# Patient Record
Sex: Male | Born: 1935 | Race: White | Hispanic: No | Smoking: Former smoker
Health system: Southern US, Community
[De-identification: ages and names within clinical notes are randomized; demographics above are authoritative.]

## PROBLEM LIST (undated history)

## (undated) DIAGNOSIS — M069 Rheumatoid arthritis, unspecified: Secondary | ICD-10-CM

## (undated) DIAGNOSIS — Z952 Presence of prosthetic heart valve: Secondary | ICD-10-CM

## (undated) DIAGNOSIS — E785 Hyperlipidemia, unspecified: Secondary | ICD-10-CM

## (undated) DIAGNOSIS — I639 Cerebral infarction, unspecified: Secondary | ICD-10-CM

## (undated) DIAGNOSIS — J449 Chronic obstructive pulmonary disease, unspecified: Secondary | ICD-10-CM

## (undated) DIAGNOSIS — I2510 Atherosclerotic heart disease of native coronary artery without angina pectoris: Secondary | ICD-10-CM

## (undated) DIAGNOSIS — D649 Anemia, unspecified: Secondary | ICD-10-CM

## (undated) DIAGNOSIS — M109 Gout, unspecified: Secondary | ICD-10-CM

## (undated) DIAGNOSIS — I10 Essential (primary) hypertension: Secondary | ICD-10-CM

## (undated) DIAGNOSIS — I350 Nonrheumatic aortic (valve) stenosis: Secondary | ICD-10-CM

## (undated) HISTORY — PX: OTHER SURGICAL HISTORY: SHX169

## (undated) HISTORY — DX: Gout, unspecified: M10.9

## (undated) HISTORY — DX: Essential (primary) hypertension: I10

## (undated) HISTORY — PX: CARDIAC VALVE REPLACEMENT: SHX585

## (undated) HISTORY — DX: Hyperlipidemia, unspecified: E78.5

## (undated) HISTORY — PX: AORTIC VALVE REPLACEMENT (AVR)/CORONARY ARTERY BYPASS GRAFTING (CABG): SHX5725

## (undated) HISTORY — PX: TONSILLECTOMY: SUR1361

## (undated) HISTORY — PX: CORONARY ARTERY BYPASS GRAFT: SHX141

## (undated) HISTORY — PX: EYE SURGERY: SHX253

## (undated) HISTORY — PX: HERNIA REPAIR: SHX51

---

## 2005-07-28 ENCOUNTER — Ambulatory Visit: Payer: Self-pay | Admitting: Family Medicine

## 2006-01-20 ENCOUNTER — Ambulatory Visit: Payer: Self-pay | Admitting: Unknown Physician Specialty

## 2009-07-19 ENCOUNTER — Ambulatory Visit: Payer: Self-pay | Admitting: Surgery

## 2009-07-22 ENCOUNTER — Ambulatory Visit: Payer: Self-pay | Admitting: Surgery

## 2010-06-12 ENCOUNTER — Ambulatory Visit: Payer: Self-pay | Admitting: Family Medicine

## 2011-12-25 LAB — CBC WITH DIFFERENTIAL/PLATELET
Basophil #: 0.1 10*3/uL (ref 0.0–0.1)
Eosinophil #: 0.2 10*3/uL (ref 0.0–0.7)
Eosinophil %: 1.4 %
Lymphocyte #: 0.9 10*3/uL — ABNORMAL LOW (ref 1.0–3.6)
Lymphocyte %: 7.1 %
Monocyte %: 8.4 %
Neutrophil #: 10.9 10*3/uL — ABNORMAL HIGH (ref 1.4–6.5)
RBC: 4 10*6/uL — ABNORMAL LOW (ref 4.40–5.90)
RDW: 17 % — ABNORMAL HIGH (ref 11.5–14.5)

## 2011-12-25 LAB — BASIC METABOLIC PANEL
Anion Gap: 10 (ref 7–16)
BUN: 31 mg/dL — ABNORMAL HIGH (ref 7–18)
Creatinine: 1.49 mg/dL — ABNORMAL HIGH (ref 0.60–1.30)
EGFR (African American): 50 — ABNORMAL LOW
EGFR (Non-African Amer.): 43 — ABNORMAL LOW
Glucose: 159 mg/dL — ABNORMAL HIGH (ref 65–99)
Osmolality: 293 (ref 275–301)
Sodium: 142 mmol/L (ref 136–145)

## 2011-12-25 LAB — PROTIME-INR: INR: 0.9

## 2011-12-25 LAB — CK TOTAL AND CKMB (NOT AT ARMC): CK-MB: 1.7 ng/mL (ref 0.5–3.6)

## 2011-12-26 ENCOUNTER — Inpatient Hospital Stay: Payer: Self-pay | Admitting: Internal Medicine

## 2011-12-26 LAB — HEMOGLOBIN: HGB: 9.5 g/dL — ABNORMAL LOW (ref 13.0–18.0)

## 2011-12-27 LAB — BASIC METABOLIC PANEL
Anion Gap: 9 (ref 7–16)
BUN: 16 mg/dL (ref 7–18)
EGFR (African American): >60
EGFR (Non-African Amer.): >60
Osmolality: 281 (ref 275–301)
Potassium: 4.4 mmol/L (ref 3.5–5.1)
Sodium: 140 mmol/L (ref 136–145)

## 2011-12-27 LAB — CBC WITH DIFFERENTIAL/PLATELET
Basophil %: 1.5 %
Eosinophil %: 1.9 %
HCT: 25.8 % — ABNORMAL LOW (ref 40.0–52.0)
Lymphocyte #: 0.9 10*3/uL — ABNORMAL LOW (ref 1.0–3.6)
Lymphocyte %: 9.7 %
Monocyte #: 0.9 x10 3/mm (ref 0.2–1.0)
Monocyte %: 9.5 %
Neutrophil %: 77.4 %
Platelet: 134 10*3/uL — ABNORMAL LOW (ref 150–440)
RBC: 2.84 10*6/uL — ABNORMAL LOW (ref 4.40–5.90)
RDW: 17.2 % — ABNORMAL HIGH (ref 11.5–14.5)
WBC: 9.2 10*3/uL (ref 3.8–10.6)

## 2012-05-09 ENCOUNTER — Ambulatory Visit: Payer: Self-pay | Admitting: Cardiology

## 2012-05-23 ENCOUNTER — Other Ambulatory Visit: Payer: Self-pay | Admitting: Rheumatology

## 2012-05-23 LAB — SYNOVIAL CELL COUNT + DIFF, W/ CRYSTALS
Eosinophil: 0 %
Lymphocytes: 4 %
Neutrophils: 83 %
Nucleated Cell Count: 14075 /mm3
Other Mononuclear Cells: 13 %

## 2012-11-28 ENCOUNTER — Encounter: Payer: Self-pay | Admitting: Cardiology

## 2012-12-01 ENCOUNTER — Encounter: Payer: Self-pay | Admitting: Cardiology

## 2013-01-01 ENCOUNTER — Encounter: Payer: Self-pay | Admitting: Cardiology

## 2013-01-19 ENCOUNTER — Encounter: Payer: Self-pay | Admitting: Rheumatology

## 2013-01-25 ENCOUNTER — Other Ambulatory Visit: Payer: Self-pay | Admitting: Rheumatology

## 2013-01-25 LAB — SYNOVIAL CELL COUNT + DIFF, W/ CRYSTALS
Basophil: 0 %
Eosinophil: 0 %
Neutrophils: 70 %
Other Cells BF: 0 %
Other Mononuclear Cells: 23 %

## 2013-01-31 ENCOUNTER — Encounter: Payer: Self-pay | Admitting: Rheumatology

## 2013-02-01 ENCOUNTER — Encounter: Payer: Self-pay | Admitting: Cardiology

## 2014-01-03 ENCOUNTER — Ambulatory Visit: Payer: Self-pay | Admitting: Ophthalmology

## 2014-11-25 NOTE — Consult Note (Signed)
PATIENT NAME:  Dakota [name], Dakota [name] MR#:  [id number] DATE OF BIRTH:  1925-08-29  DATE OF CONSULTATION:  12/26/2011  REFERRING PHYSICIAN:   CONSULTING PHYSICIAN:  Ezzard Standing. Bluford Kaufmann, MD  REASON FOR REFERRAL: Hematochezia.   DESCRIPTION: The patient is an 81 year old white male with known history of gout, kidney stones, and hypertension who started passing blood per rectum earlier this evening. The patient was trying to go to the bathroom and then started seeing some blood. This was then followed by several more episodes of liquidy blood. Throughout this he had no abdominal pain, nausea, vomiting, or fever.   The patient has been experiencing some swelling in his left foot and hand and has been on some prednisone by Dr. Gavin Potters. He has also been taking Etodolac as well. He also admitted to taking some Aleve and Naprosyn p.r.n. for pain.   The patient has had at least two colonoscopies in the past. They were both performed by Dr. Mechele Collin. The patient has had history of diverticulosis, diffuse, colonic polyps that have been removed, large lipoma and internal hemorrhoids. The last colonoscopy was done around 2007. The patient also admitted to eating a lot of nuts and popcorn recently as well.   REVIEW OF SYSTEMS: There is no fever or chills. No coughing, shortness of breath, chest pain or palpitations. There is no nausea, heartburn, or indigestion. Again, there is no abdominal pain, however, there is gross hematochezia. The rest of the review of symptoms have been described already.   PAST MEDICAL HISTORY:  1. Hypertension. 2. Gout. 3. Kidney stones.  4. Glaucoma.  5. Again, last colonoscopy was in 2007.   PAST SURGICAL HISTORY:  1. Inguinal hernia repair. 2. Left arm surgery. 3. Kidney stone removal.   ALLERGIES: He is allergic to penicillin.   MEDICATIONS AT HOME:  1. Allopurinol. 2. Benicar. 3. Glaucoma drops. 4. Etodolac. 5. Prednisone taper.   FAMILY HISTORY: Essentially negative.    SOCIAL HISTORY: He quit smoking 25 years ago and quit alcohol in 1978.   PHYSICAL EXAMINATION:   GENERAL: The patient is in no acute distress.   VITAL SIGNS: He was afebrile. Initial vital signs show temperature 98, pulse 114, respirations 18, blood pressure 119/67.   HEENT: Normocephalic, atraumatic head. Pupils are equally reactive. Throat was clear.   NECK: Supple.   CARDIAC: Regular rhythm and rate but he was somewhat tachycardic. I did appreciate a systolic murmur.   LUNGS: Lungs are clear bilaterally.   ABDOMEN: Normoactive bowel sounds, soft, nontender. There is no hepatomegaly. There are no palpable masses. The patient had active bowel sounds.   EXTREMITIES: Some edema in the lower extremities. There was also some swelling in his right hand area.  LABS ON ADMISSION: White count 13.3, hemoglobin 11.4, platelet count 230, sodium 142, potassium 4.5, chloride 106, CO2 26, BUN 31, creatinine 1.49, glucose 159. INR 0.9. A bleeding scan was done on admission that came back negative.   ASSESSMENT AND PLAN: This is a patient with known history of diffuse diverticulosis and internal hemorrhoids. He also has a history of polyps, the pathology of which I do not have. Most likely bleeding is diverticulosis although he could have some internal hemorrhoids again because he was straining when he started passing blood.   My recommendation is to monitor hemoglobin. We will start him on a clear liquid diet and advance as tolerated if the bleeding stops. Hopefully with him being off the prednisone and etodolac that the bleeding will stop on its own  without [place] having to do any further intervention. On the other hand, if the bleeding recurs again then we will consider repeating the colonoscopy.   Thank you for the referral.   ____________________________ Ezzard Standing. Bluford Kaufmann, MD pyo:drc D: 12/27/2011 09:10:00 ET T: 12/27/2011 10:33:58 ET JOB#: [id number]  cc: Ezzard Standing. Bluford Kaufmann, MD, <Dictator> Ezzard Standing Jill Stopka  MD ELECTRONICALLY SIGNED 12/29/2011 10:25

## 2014-11-25 NOTE — Consult Note (Signed)
Chief Complaint:   Subjective/Chief Complaint C/O more of muscle aches. Prednisone which were  helping was held since admission. Reg BM's now. No further bleeding. Hgb stable since yest.   VITAL SIGNS/ANCILLARY NOTES: **Vital Signs.:   26-May-13 08:52   Vital Signs Type Routine   Temperature Temperature (F) 100   Celsius 37.7   Pulse Pulse 99   Respirations Respirations 18   Systolic BP Systolic BP 94   Diastolic BP (mmHg) Diastolic BP (mmHg) 60   Mean BP 71   Pulse Ox % Pulse Ox % 100   Pulse Ox Activity Level  At rest   Oxygen Delivery Room Air/ 21 %   Brief Assessment:   Cardiac Regular    Respiratory clear BS    Gastrointestinal Normal    Additional Physical Exam Right arm swollen, which is chronic   Routine Hem:  26-May-13 05:47    WBC (CBC) 9.2   RBC (CBC) 2.84   Hemoglobin (CBC) 8.3   Hematocrit (CBC) 25.8   Platelet Count (CBC) 134   MCV 91   MCH 29.2   MCHC 32.2   RDW 17.2   Neutrophil % 77.4   Lymphocyte % 9.7   Monocyte % 9.5   Eosinophil % 1.9   Basophil % 1.5   Neutrophil # 7.1   Lymphocyte # 0.9   Monocyte # 0.9   Eosinophil # 0.2   Basophil # 0.1  Routine Chem:  26-May-13 05:47    Glucose, Serum 108   BUN 16   Creatinine (comp) 0.96   Sodium, Serum 140   Potassium, Serum 4.4   Chloride, Serum 108   CO2, Serum 23   Calcium (Total), Serum 7.8   Anion Gap 9   Osmolality (calc) 281   eGFR (African American) >60   eGFR (Non-African American) >60   Magnesium, Serum 1.5   Assessment/Plan:  Assessment/Plan:   Assessment Likely diverticular bleeding.    Plan If tolerates solids, then can discharge patient to home on high fiber diet. Can resume po prednisone if patient wishes. Can f/u in our office later. Will sign off. Thanks.   Electronic Signatures: Verdie Shire (MD)  (Signed [phone] 09:04)  Authored: Chief Complaint, VITAL SIGNS/ANCILLARY NOTES, Brief Assessment, Lab Results, Assessment/Plan   Last Updated: 26-May-13 09:04 by Verdie Shire (MD)

## 2014-11-25 NOTE — Consult Note (Signed)
Full consult to follow. Admitted with painless rectal bleeding. Known hx of diffuse diverticulosis, colon polyps(path not available for review), large lipoma, and internal hemorrhoids. Bleeding did start after straining for defacation. Pt on NSAIDS/prednisone recently. Nuts and popcorn intake recently as well. Bleeding scan overnight neg. No further bleeding since. Hgb drifting downwards. Likely diverticular bleeding but hemorrhoidal bleeding also possible. Moniter hgb and transfuse if needed. Advance diet as tolerated. Repeat colonoscopy only if bleeding recurs and is significant. Thanks.  Electronic Signatures: Lutricia Feil (MD)  (Signed on 25-May-13 09:30)  Authored  Last Updated: 25-May-13 09:30 by Lutricia Feil (MD)

## 2014-11-25 NOTE — Discharge Summary (Signed)
PATIENT NAME:  Dakota [name], Dakota [name] MR#:  [id number] DATE OF BIRTH:  02/27/1926  DATE OF ADMISSION:  12/26/2011 DATE OF DISCHARGE:  12/27/2011  ADMITTING DIAGNOSIS: Bright red blood per rectum.  DISCHARGE DIAGNOSES:  1. Acute lower gastrointestinal bleed felt to be due to possible internal hemorrhoids and/or diverticular in nature status post right gastroenterology evaluation.  2. Acute blood loss anemia due to lower gastrointestinal bleed. The patient's hemoglobin did drop to 8.3, did not require transfusion. Will repeat a CBC in primary MD's office.  3. Acute renal failure due to dehydration, now resolved.  4. 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 Gavin Potters in the coming week. 5. Low-grade fevers, again likely related to his arthritis.  6. Distant alcohol abuse, no alcohol since 1978.  7. Hypertension.  8. Gout.  9. History of nephrolithiasis.  10. Glaucoma.  11. History of diverticulosis, colonic polyp, large lipoma, and internal hemorrhoids.  12. Status post left inguinal hernia repair.  13. Status post left arm surgery.  14. History of lithotripsy x3.  PERTINENT LABS AND EVALUATIONS: Admitting glucose 159. On admission BUN 31, creatinine 1.49, sodium 142, potassium 4.5, chloride 106, CO2 26, calcium 8.4. Troponin less than 0.02. WBC 13.3, hemoglobin 11.4, platelet count 230. INR 0.9. Hemoglobin today is 8.3. Magnesium was 1.5.   GI bleeding scan showed no evidence of acute GI bleed.  CONSULTANTS: Lutricia Feil, MD  HOSPITAL COURSE: Please see the history and physical done by the admitting physician. The patient is a pleasant 81 year old white male with history of hypertension, gout, glaucoma, nephrolithiasis, and previous history of diverticulosis who presented with acute onset of bright red blood per rectum. The patient reported prior to the episode he was straining  significantly. He also had recently been on prednisone and NSAIDs. The patient was seen in the ED and had a bleeding scan initially, which was negative. It was felt that his bleeding was likely due to internal hemorrhoids or diverticular in nature. He was monitored in the hospital, was seen by gastroenterology, Dr. Bluford Kaufmann, and his diet was advanced slowly. The patient's hemoglobin did drop due to acute blood loss, but has not had any further bleeding. He was seen by Dr. Bluford Kaufmann who stated that if he is able to tolerate solid food, he is able to be discharged home. At this time, the patient is stable for discharge.   DISCHARGE MEDICATIONS:  1. Allopurinol 300 mg daily. 2. Guaifenesin 400 mg daily as needed. 3. Benicar 20 mg daily.  4. Prednisone taper 10 mg p.o. daily x5 days. 5. Prednisone 5 mg p.o. daily x5 days.  6. Tylenol 650 mg every 6 hours p.r.n. pain.  7. MiraLax 17 grams p.o. daily in 8 ounces of water daily.   NOTE: The patient is requested not to take Aleve or etodolac for now.   DIET: Low sodium.   ACTIVITY: As tolerated.   FOLLOWUP: Followup with Dr. Elizabeth Sauer in 1 to 2 weeks. Check CBC at time of visit to the primary MD.   TIME SPENT: 35 minutes. ____________________________ Lacie Scotts Allena Katz, MD shp:slb D: 12/27/2011 12:22:05 ET     T: 12/28/2011 14:17:18 ET        JOB#: [id number] cc: Elysia Grand H. Allena Katz, MD, <Dictator> Charise Carwin MD ELECTRONICALLY SIGNED 12/31/2011 13:36

## 2014-11-25 NOTE — H&P (Signed)
PATIENT NAME:  Dakota [name], Dakota [name] MR#:  [id number] DATE OF BIRTH:  Jul 10, 1926  DATE OF ADMISSION:  12/26/2011  REFERRING PHYSICIAN: Dr. Daphine Deutscher   PRIMARY CARE PHYSICIAN: Elizabeth Sauer, MD    PRESENTING COMPLAINT: Bloody stools.   HISTORY OF PRESENT ILLNESS: Dakota [name] is a pleasant 81 year old gentleman with history of hypertension, gout, glaucoma, nephrolithiasis, and diverticulosis who presents with reports of developing onset of bright red blood per rectum earlier this evening. He had initial bowel movement which was formed followed by frank bloody stool. He then had a second episode which was pure liquid blood followed by a third episode with pure liquid blood and on his arrival here had a fourth episode where he had another frank bleeding. He denies any abdominal pain. No nausea or vomiting. No hematemesis. He denies any presyncope or syncope. No chest pain or shortness of breath. No palpitations. The patient reports that approximately 12 weeks ago he started having problems with left foot pain and swelling as well as right hand pain and swelling. On 12/08/2011 he was initiated on Etodolac as well as prednisone taper. He completed the prednisone taper yesterday but is still on Etodolac. Prior to that he was taking limited use of Naprosyn and Aleve as needed for pain.   PAST MEDICAL HISTORY:  1. Distant alcohol abuse. No alcohol since 1978.  2. Hypertension.  3. Gout.  4. Nephrolithiasis.  5. Glaucoma.  6. Colonoscopy in June 2007 showed diverticulosis, colonic polyps, large lipoma, and internal hemorrhoids.   PAST SURGICAL HISTORY:  1. Left inguinal hernia repair.  2. Left arm surgery.  3. Lithotripsies at least three times.   ALLERGIES: Penicillin.   MEDICATIONS:  1. Allopurinol 300 mg daily.  2. Benicar 20 mg daily.  3. Guaifenesin over-the-counter 400 mg daily.  4. Glaucoma drops, unknown name or dose.  5. Etodolac 500 mg b.i.d.  6. Complete a prednisone taper yesterday.    FAMILY HISTORY: No heart attack, strokes, diabetes, hypertension.   SOCIAL HISTORY: He lives in [place] with his wife. He quit tobacco 25 years ago. Quit alcohol in 1978. Previous history of alcoholism. No drug use. He was in the [employer].  REVIEW OF SYSTEMS: CONSTITUTIONAL: No fevers, nausea, or vomiting. EYES: History of glaucoma. ENT: No epistaxis or discharge. RESPIRATORY: No cough, wheezing, hemoptysis. CARDIOVASCULAR: No chest pain, palpitations, syncope. He had edema of the left lower leg and right hand. GU: No dysuria or hematuria. ENDOCRINE: No polyuria. HEME: No bleeding. SKIN: He developed a rash on his left lower extremity after working in the yard. MUSCULOSKELETAL: Reports pain in his left foot and leg as well as right hand with history of gout. NEUROLOGIC: No history of stroke or seizures. PSYCH: Denies any suicidal ideation.   PHYSICAL EXAMINATION:   VITAL SIGNS: Temperature 98, pulse 114, respiratory rate 18, blood pressure 119/67, sating at 98% on room air.   GENERAL: Lying in bed in no apparent distress.   HEENT: Normocephalic, atraumatic. Pupils equal, symmetric, nonicteric. Nares without discharge. Moist mucous membrane.   NECK: Soft and supple. No adenopathy or JVP.   CARDIOVASCULAR: Slightly tachy with systolic murmur. No rubs or gallops.   LUNGS: Clear to auscultation bilaterally. No use of accessory muscles or increased respiratory effort.   ABDOMEN: Soft with hyperactive bowel sounds. No mass appreciated.   EXTREMITIES: Trace edema of the left lower extremity. Dorsal pedis pulses 1+ bilaterally.   MUSCULOSKELETAL: No joint effusion in his ankle or knees. He has joint effusion of his  right hand.   NEUROLOGIC: No dysarthria or aphasia. Strength exam is limited as the patient has pain and swelling of his right hand and does not have full squeeze. He has 5 out of 5 strength of his left hand.   PERTINENT LABS AND STUDIES: WBC 13.3, hemoglobin 11.4, hematocrit 36,  platelet 230, MCV 90, glucose 159, BUN 31, creatinine 1.49, sodium 142, potassium 4.5, chloride 106, carbon dioxide 26, calcium 8.4. INR 0.9. CK 36. MB 1.7, troponin less than 0.02.   ASSESSMENT AND PLAN: Dakota [name] is a pleasant 81 year old gentleman with history of hypertension, gout, diverticulosis, glaucoma, distant alcohol abuse, and nephrolithiasis presenting with reports of bloody stools.  1. GI bleed, likely lower, possible source is diverticular. Colonoscopy results as above. Mild leukocytosis likely in the setting of bleed, low suspicion for diverticulitis. Will hold off on antibiotics for now unless recommended by GI. The patient has been on Etodolac and steroid taper for presumed gout since 12/08/2011. Also reports prior history of Naprosyn and Aleve, likely perpetuated his GI bleed, doubtful alcohol related as he has not had any alcohol for over 30 years. Will start on PPI b.i.d., clear diet, IV fluids. Cycle his hemoglobin every six hours. Transfuse as needed. With relative hypotension likely in the setting of his bleed and hypovolemia, will obtain a bleeding scan and GI consultation. Further work-up and recommendations per findings.  2. Acute renal failure with elevated BUN likely in the setting of bleed. As above, IV fluids and IV transfusion if needed. Will hold Benicar for now.  3. Hypertension with relative hypotension. As above, holding Benicar.  4. Questionable gout related flare. He is in the process of outpatient work-up for right hand and left foot pain and swelling with Podiatry, Rheumatology, and Vascular Surgery. Will resume his allopurinol with close following of his creatinine.  5. Hyperglycemia likely with recent steroid use and stress state. Will send an A1c.  6. Prophylaxis with TEDs, SCDs, and Protonix.     TIME SPENT: Approximately 50 minutes on patient care.   ____________________________ Reuel Derby, MD ap:drc D: 12/26/2011 00:58:31 ET T: 12/26/2011  09:10:41 ET JOB#: [id number]  cc: Pearlean Brownie Eleena Grater, MD, <Dictator> Duanne Limerick, MD Reuel Derby MD ELECTRONICALLY SIGNED 12/30/2011 22:50

## 2015-05-21 ENCOUNTER — Ambulatory Visit (INDEPENDENT_AMBULATORY_CARE_PROVIDER_SITE_OTHER): Payer: Medicare PPO

## 2015-05-21 DIAGNOSIS — Z23 Encounter for immunization: Secondary | ICD-10-CM | POA: Diagnosis not present

## 2016-01-20 ENCOUNTER — Other Ambulatory Visit: Payer: Self-pay | Admitting: Family Medicine

## 2016-05-01 ENCOUNTER — Ambulatory Visit (INDEPENDENT_AMBULATORY_CARE_PROVIDER_SITE_OTHER): Payer: Medicare PPO | Admitting: Family Medicine

## 2016-05-01 ENCOUNTER — Encounter: Payer: Self-pay | Admitting: Family Medicine

## 2016-05-01 VITALS — BP 120/72 | HR 84 | Temp 98.0°F | Ht 68.0 in | Wt 187.0 lb

## 2016-05-01 DIAGNOSIS — Z23 Encounter for immunization: Secondary | ICD-10-CM

## 2016-05-01 DIAGNOSIS — J4520 Mild intermittent asthma, uncomplicated: Secondary | ICD-10-CM | POA: Diagnosis not present

## 2016-05-01 DIAGNOSIS — J0100 Acute maxillary sinusitis, unspecified: Secondary | ICD-10-CM | POA: Diagnosis not present

## 2016-05-01 DIAGNOSIS — I739 Peripheral vascular disease, unspecified: Secondary | ICD-10-CM | POA: Diagnosis not present

## 2016-05-01 MED ORDER — AZITHROMYCIN 250 MG PO TABS: ORAL_TABLET | ORAL | 0 refills | Status: DC

## 2016-05-01 MED ORDER — ALBUTEROL SULFATE HFA 108 (90 BASE) MCG/ACT IN AERS: 2.0000 | INHALATION_SPRAY | Freq: Four times a day (QID) | RESPIRATORY_TRACT | 0 refills | Status: DC | PRN

## 2016-05-01 NOTE — Patient Instructions (Signed)
How to Use an Inhaler °Proper inhaler technique is very important. Good technique ensures that the medicine reaches the lungs. Poor technique results in depositing the medicine on the tongue and back of the throat rather than in the airways. If you do not use the inhaler with good technique, the medicine will not help you. °STEPS TO FOLLOW IF USING AN INHALER WITHOUT AN EXTENSION TUBE °1. Remove the cap from the inhaler. °2. If you are using the inhaler for the first time, you will need to prime it. Shake the inhaler for 5 seconds and release four puffs into the air, away from your face. Ask your health care provider or pharmacist if you have questions about priming your inhaler. °3. Shake the inhaler for 5 seconds before each breath in (inhalation). °4. Position the inhaler so that the top of the canister faces up. °5. Put your index finger on the top of the medicine canister. Your thumb supports the bottom of the inhaler. °6. Open your mouth. °7. Either place the inhaler between your teeth and place your lips tightly around the mouthpiece, or hold the inhaler 1-2 inches away from your open mouth. If you are unsure of which technique to use, ask your health care provider. °8. Breathe out (exhale) normally and as completely as possible. °9. Press the canister down with your index finger to release the medicine. °10. At the same time as the canister is pressed, inhale deeply and slowly until your lungs are completely filled. This should take 4-6 seconds. Keep your tongue down. °11. Hold the medicine in your lungs for 5-10 seconds (10 seconds is best). This helps the medicine get into the small airways of your lungs. °12. Breathe out slowly, through pursed lips. Whistling is an example of pursed lips. °13. Wait at least 15-30 seconds between puffs. Continue with the above steps until you have taken the number of puffs your health care provider has ordered. Do not use the inhaler more than your health care provider  tells you. °14. Replace the cap on the inhaler. °15. Follow the directions from your health care provider or the inhaler insert for cleaning the inhaler. °STEPS TO FOLLOW IF USING AN INHALER WITH AN EXTENSION (SPACER) °1. Remove the cap from the inhaler. °2. If you are using the inhaler for the first time, you will need to prime it. Shake the inhaler for 5 seconds and release four puffs into the air, away from your face. Ask your health care provider or pharmacist if you have questions about priming your inhaler. °3. Shake the inhaler for 5 seconds before each breath in (inhalation). °4. Place the open end of the spacer onto the mouthpiece of the inhaler. °5. Position the inhaler so that the top of the canister faces up and the spacer mouthpiece faces you. °6. Put your index finger on the top of the medicine canister. Your thumb supports the bottom of the inhaler and the spacer. °7. Breathe out (exhale) normally and as completely as possible. °8. Immediately after exhaling, place the spacer between your teeth and into your mouth. Close your lips tightly around the spacer. °9. Press the canister down with your index finger to release the medicine. °10. At the same time as the canister is pressed, inhale deeply and slowly until your lungs are completely filled. This should take 4-6 seconds. Keep your tongue down and out of the way. °11. Hold the medicine in your lungs for 5-10 seconds (10 seconds is best). This helps the   medicine get into the small airways of your lungs. Exhale. °12. Repeat inhaling deeply through the spacer mouthpiece. Again hold that breath for up to 10 seconds (10 seconds is best). Exhale slowly. If it is difficult to take this second deep breath through the spacer, breathe normally several times through the spacer. Remove the spacer from your mouth. °13. Wait at least 15-30 seconds between puffs. Continue with the above steps until you have taken the number of puffs your health care provider has  ordered. Do not use the inhaler more than your health care provider tells you. °14. Remove the spacer from the inhaler, and place the cap on the inhaler. °15. Follow the directions from your health care provider or the inhaler insert for cleaning the inhaler and spacer. °If you are using different kinds of inhalers, use your quick relief medicine to open the airways 10-15 minutes before using a steroid if instructed to do so by your health care provider. If you are unsure which inhalers to use and the order of using them, ask your health care provider, nurse, or respiratory therapist. °If you are using a steroid inhaler, always rinse your mouth with water after your last puff, then gargle and spit out the water. Do not swallow the water. °AVOID: °· Inhaling before or after starting the spray of medicine. It takes practice to coordinate your breathing with triggering the spray. °· Inhaling through the nose (rather than the mouth) when triggering the spray. °HOW TO DETERMINE IF YOUR INHALER IS FULL OR NEARLY EMPTY °You cannot know when an inhaler is empty by shaking it. A few inhalers are now being made with dose counters. Ask your health care provider for a prescription that has a dose counter if you feel you need that extra help. If your inhaler does not have a counter, ask your health care provider to help you determine the date you need to refill your inhaler. Write the refill date on a calendar or your inhaler canister. Refill your inhaler 7-10 days before it runs out. Be sure to keep an adequate supply of medicine. This includes making sure it is not expired, and that you have a spare inhaler.  °SEEK MEDICAL CARE IF:  °· Your symptoms are only partially relieved with your inhaler. °· You are having trouble using your inhaler. °· You have some increase in phlegm. °SEEK IMMEDIATE MEDICAL CARE IF:  °· You feel little or no relief with your inhalers. You are still wheezing and are feeling shortness of breath or  tightness in your chest or both. °· You have dizziness, headaches, or a fast heart rate. °· You have chills, fever, or night sweats. °· You have a noticeable increase in phlegm production, or there is blood in the phlegm. °MAKE SURE YOU:  °· Understand these instructions. °· Will watch your condition. °· Will get help right away if you are not doing well or get worse. °  °This information is not intended to replace advice given to you by your health care provider. Make sure you discuss any questions you have with your health care provider. °  °Document Released: 07/17/2000 Document Revised: 05/10/2013 Document Reviewed: 02/16/2013 °[employer] Interactive Patient Education ©2016 [employer] ° °

## 2016-05-01 NOTE — Progress Notes (Signed)
Name: Dakota [name]   MRN: [id number]    DOB: 29-May-1926   Date:05/01/2016       Progress Note  Subjective  Chief Complaint  Chief Complaint  Patient presents with  . Sinusitis    cough and cong- wheezing    Sinusitis  This is a new problem. The current episode started more than 1 year ago. There has been no fever. The pain is mild. Associated symptoms include congestion, ear pain, sinus pressure and a sore throat. Pertinent negatives include no chills, coughing, diaphoresis, headaches, hoarse voice, neck pain, shortness of breath, sneezing or swollen glands. Past treatments include nothing. The treatment provided mild relief.  Cough  This is a recurrent problem. The current episode started in the past 7 days. The problem has been waxing and waning. The cough is non-productive. Associated symptoms include ear pain, a sore throat and wheezing. Pertinent negatives include no chest pain, chills, fever, headaches, heartburn, hemoptysis, myalgias, postnasal drip, rash, shortness of breath or weight loss. He has tried a beta-agonist inhaler (per daughter) for the symptoms. The treatment provided mild relief. There is no history of emphysema or environmental allergies.  Leg Pain   Incident onset: hx pseudoaneurysm right femoral. The pain is present in the right toes and left toes. The quality of the pain is described as aching. The pain has been intermittent (cold/cyanosis) since onset. Pertinent negatives include no tingling. He has tried nothing for the symptoms.    No problem-specific Assessment & Plan notes found for this encounter.   Past Medical History:  Diagnosis Date  . Gout   . Hyperlipidemia   . Hypertension     Past Surgical History:  Procedure Laterality Date  . CARDIAC VALVE REPLACEMENT    . EYE SURGERY     cataract  . HERNIA REPAIR      History reviewed. No pertinent family history.  Social History   Social History  . Marital status: Widowed    Spouse name: N/A  .  Number of children: N/A  . Years of education: N/A   Occupational History  . Not on file.   Social History Main Topics  . Smoking status: Former Games developer  . Smokeless tobacco: Never Used  . Alcohol use Not on file  . Drug use: Unknown  . Sexual activity: Not on file   Other Topics Concern  . Not on file   Social History Narrative  . No narrative on file    Allergies  Allergen Reactions  . Colchicine Shortness Of Breath  . Penicillin G Rash     Review of Systems  Constitutional: Negative for chills, diaphoresis, fever, malaise/fatigue and weight loss.  HENT: Positive for congestion, ear pain, sinus pressure and sore throat. Negative for ear discharge, hoarse voice, postnasal drip and sneezing.   Eyes: Negative for blurred vision.  Respiratory: Positive for wheezing. Negative for cough, hemoptysis, sputum production and shortness of breath.   Cardiovascular: Negative for chest pain, palpitations and leg swelling.  Gastrointestinal: Negative for abdominal pain, blood in stool, constipation, diarrhea, heartburn, melena and nausea.  Genitourinary: Negative for dysuria, frequency, hematuria and urgency.  Musculoskeletal: Negative for back pain, joint pain, myalgias and neck pain.  Skin: Negative for rash.  Neurological: Negative for dizziness, tingling, sensory change, focal weakness and headaches.  Endo/Heme/Allergies: Negative for environmental allergies and polydipsia. Does not bruise/bleed easily.  Psychiatric/Behavioral: Negative for depression and suicidal ideas. The patient is not nervous/anxious and does not have insomnia.      Objective  Vitals:   05/01/16 1356  BP: 120/72  Pulse: 84  Temp: 98 F (36.7 C)  TempSrc: Oral  Weight: 187 lb (84.8 kg)  Height: 5\' 8"  (1.727 m)    Physical Exam  Constitutional: He is oriented to person, place, and time and well-developed, well-nourished, and in no distress.  HENT:  Head: Normocephalic.  Right Ear: Tympanic  membrane, external ear and ear canal normal.  Left Ear: Tympanic membrane, external ear and ear canal normal.  Nose: Nose normal.  Mouth/Throat: Oropharynx is clear and moist. No posterior oropharyngeal erythema.  Eyes: Conjunctivae and EOM are normal. Pupils are equal, round, and reactive to light. Right eye exhibits no discharge. Left eye exhibits no discharge. No scleral icterus.  Neck: Normal range of motion. Neck supple. No JVD present. No tracheal deviation present. No thyromegaly present.  Cardiovascular: Normal rate, regular rhythm, normal heart sounds and intact distal pulses.  Exam reveals no gallop and no friction rub.   No murmur heard. Pulses:      Dorsalis pedis pulses are 0 on the right side, and 0 on the left side.       Posterior tibial pulses are 0 on the right side, and 0 on the left side.  Pulmonary/Chest: Effort normal. No respiratory distress. He has wheezes. He has no rales.  Abdominal: Soft. Bowel sounds are normal. He exhibits no mass. There is no hepatosplenomegaly. There is no tenderness. There is no rebound, no guarding and no CVA tenderness.  Musculoskeletal: Normal range of motion. He exhibits no edema or tenderness.  Lymphadenopathy:    He has no cervical adenopathy.  Neurological: He is alert and oriented to person, place, and time. He has normal sensation, normal strength, normal reflexes and intact cranial nerves. No cranial nerve deficit.  Skin: Skin is warm. No rash noted. There is cyanosis.  Cyanosis toes r>l  Psychiatric: Mood and affect normal.      Assessment & Plan  Problem List Items Addressed This Visit    None    Visit Diagnoses    Acute maxillary sinusitis, recurrence not specified    -  Primary   Relevant Medications   hydroxychloroquine (PLAQUENIL) 200 MG tablet   azithromycin (ZITHROMAX) 250 MG tablet   Reactive airway disease, mild intermittent, uncomplicated       Relevant Medications   albuterol (PROVENTIL HFA;VENTOLIN HFA) 108  (90 Base) MCG/ACT inhaler   PAD (peripheral artery disease) (HCC)       Relevant Medications   furosemide (LASIX) 20 MG tablet   metoprolol tartrate (LOPRESSOR) 25 MG tablet   pravastatin (PRAVACHOL) 10 MG tablet   aspirin EC 81 MG tablet   Other Relevant Orders   Ambulatory referral to Vascular Surgery   Immunization due       Relevant Orders   Flu Vaccine QUAD 36+ mos PF IM (Fluarix & Fluzone Quad PF) (Completed)     I spent 15 minutes with this patient, More than 50% of that time was spent in face to face education, counseling and care coordination.   Dr. [facility]  05/01/16

## 2016-05-12 ENCOUNTER — Encounter (INDEPENDENT_AMBULATORY_CARE_PROVIDER_SITE_OTHER): Payer: Self-pay | Admitting: Vascular Surgery

## 2016-05-12 ENCOUNTER — Ambulatory Visit (INDEPENDENT_AMBULATORY_CARE_PROVIDER_SITE_OTHER): Payer: Medicare PPO | Admitting: Vascular Surgery

## 2016-05-12 VITALS — BP 163/72 | HR 70 | Resp 16 | Ht 68.5 in | Wt 189.0 lb

## 2016-05-12 DIAGNOSIS — I10 Essential (primary) hypertension: Secondary | ICD-10-CM | POA: Diagnosis not present

## 2016-05-12 DIAGNOSIS — M79662 Pain in left lower leg: Secondary | ICD-10-CM

## 2016-05-12 DIAGNOSIS — M79609 Pain in unspecified limb: Secondary | ICD-10-CM | POA: Insufficient documentation

## 2016-05-12 DIAGNOSIS — E785 Hyperlipidemia, unspecified: Secondary | ICD-10-CM | POA: Diagnosis not present

## 2016-05-12 DIAGNOSIS — M7989 Other specified soft tissue disorders: Secondary | ICD-10-CM | POA: Diagnosis not present

## 2016-05-12 DIAGNOSIS — I2510 Atherosclerotic heart disease of native coronary artery without angina pectoris: Secondary | ICD-10-CM | POA: Diagnosis not present

## 2016-05-12 NOTE — Assessment & Plan Note (Signed)
lipid control important in reducing the progression of atherosclerotic disease. Continue statin therapy  

## 2016-05-12 NOTE — Progress Notes (Signed)
Patient ID: Dakota [name], male   DOB: 12-10-1925, 82 y.o.   MRN: [id number]  Chief Complaint  Patient presents with  . New Patient (Initial Visit)    PVD    HPI Dakota [name] is a 82 y.o. male.  I am asked to see the patient by Dr. Yetta Barre for evaluation of swelling and significant cyanosis and discoloration particularly in the left foot.  The patient reports this leg is cool and discolored and has been for several years although he thinks it has gotten steadily worse. Although there was not a clear inciting event or causative factor, they started to notice symptoms more after the vein in the left leg was removed for his AVR/CABG about 4 years ago. The left leg is more noticeable than the right. The right may have a little swelling and discoloration but not as bad as the left this has gotten a little bit better as the weather has cooled off, but is still noticeable. The leg is heavy and achy. He does not have ulceration or infection. He denies fever or chills. He is on multiple medications for gout and intermittently takes steroids which does seem to exacerbate the swelling.   Past Medical History:  Diagnosis Date  . Gout   . Hyperlipidemia   . Hypertension     Past Surgical History:  Procedure Laterality Date  . CARDIAC VALVE REPLACEMENT    . EYE SURGERY     cataract  . HERNIA REPAIR      Family History  Problem Relation Age of Onset  . Congestive Heart Failure Father    No bleeding disorders, clotting disorders, autoimmune diseases, or aneurysms  Social History Social History  Substance Use Topics  . Smoking status: Former Games developer  . Smokeless tobacco: Never Used  . Alcohol use No   Married, lives with spouse  Allergies  Allergen Reactions  . Colchicine Shortness Of Breath  . Penicillin G Rash    Current Outpatient Prescriptions  Medication Sig Dispense Refill  . albuterol (PROVENTIL HFA;VENTOLIN HFA) 108 (90 Base) MCG/ACT inhaler Inhale 2 puffs into the lungs  every 6 (six) hours as needed for wheezing or shortness of breath. 1 Inhaler 0  . allopurinol (ZYLOPRIM) 300 MG tablet Take 1 tablet by mouth 2 (two) times Dakota. Dr Lavenia Atlas    . aspirin EC 81 MG tablet Take 81 mg by mouth Dakota.    Marland Kitchen azithromycin (ZITHROMAX) 250 MG tablet 2 today then 1 a day for 4 days 6 tablet 0  . furosemide (LASIX) 20 MG tablet Take 1 tablet by mouth as needed. Dr Lady Gary    . hydroxychloroquine (PLAQUENIL) 200 MG tablet Take 2 tablets by mouth Dakota. Dr Lavenia Atlas    . metoprolol tartrate (LOPRESSOR) 25 MG tablet Take 1 tablet by mouth 2 (two) times Dakota. Dr Lady Gary    . pravastatin (PRAVACHOL) 10 MG tablet Take 1 tablet by mouth at bedtime. Dr Lady Gary    . triamcinolone cream (KENALOG) 0.1 % APPLY TO FACIAL RASH 2 TIMES Dakota FOR 1 WEEK, THEN Dakota 120 g 1   No current facility-administered medications for this visit.       REVIEW OF SYSTEMS (Negative unless checked)  Constitutional: [] Weight loss  [] Fever  [] Chills Cardiac: [] Chest pain   [] Chest pressure   [x] Palpitations   [] Shortness of breath when laying flat   [] Shortness of breath at rest   [] Shortness of breath with exertion. Vascular:  [] Pain in legs with walking   []   Pain in legs at rest   [] Pain in legs when laying flat   [] Claudication   [] Pain in feet when walking  [] Pain in feet at rest  [] Pain in feet when laying flat   [] History of DVT   [] Phlebitis   [x] Swelling in legs   [] Varicose veins   [] Non-healing ulcers Pulmonary:   [] Uses home oxygen   [] Productive cough   [] Hemoptysis   [] Wheeze  [] COPD   [] Asthma Neurologic:  [] Dizziness  [] Blackouts   [] Seizures   [] History of stroke   [] History of TIA  [] Aphasia   [] Temporary blindness   [] Dysphagia   [] Weakness or numbness in arms   [] Weakness or numbness in legs Musculoskeletal:  [] Arthritis   [] Joint swelling   [x] Joint pain   [] Low back pain Hematologic:  [] Easy bruising  [] Easy bleeding   [] Hypercoagulable state   [] Anemic   [] Hepatitis Gastrointestinal:  [] Blood in stool   [] Vomiting blood  [] Gastroesophageal reflux/heartburn   [] Abdominal pain Genitourinary:  [] Chronic kidney disease   [] Difficult urination  [] Frequent urination  [] Burning with urination   [] Hematuria Skin:  [] Rashes   [] Ulcers   [] Wounds Psychological:  [] History of anxiety   []  History of major depression.    Physical Exam BP (!) 163/72   Pulse 70   Resp 16   Ht 5' 8.5" (1.74 m)   Wt 189 lb (85.7 kg)   BMI 28.32 kg/m  Gen:  WD/WN, NAD Head: [place]/AT, No temporalis wasting. Prominent temp pulse not noted. Ear/Nose/Throat: Hearing grossly intact, nares w/o erythema or drainage, oropharynx w/o Erythema/Exudate Eyes: PERRLA, EOMI.  Neck: Supple, no nuchal rigidity.  No bruit or JVD.  Pulmonary:  Good air movement, clear to auscultation bilaterally.  Cardiac: Regular rate and rhythm with murmur present Vascular:  Vessel Right Left  Radial Palpable Palpable  Ulnar Palpable Palpable  Brachial Palpable Palpable  Carotid Palpable, without bruit Palpable, without bruit  Aorta Not palpable N/A  Femoral Palpable Palpable  Popliteal Palpable Palpable  PT 1+ Palpable Not Palpable  DP Trace Palpable 1+ Palpable   Gastrointestinal: soft, non-tender/non-distended. No guarding/reflex. No masses, surgical incisions, or scars. Musculoskeletal: M/S 5/5 throughout.  Extremities without ischemic changes.  No deformity or atrophy. Minimal right lower extremity edema. 1-2+ left lower extremity edema. Some purplish discoloration is prominent throughout the forefoot on the left Neurologic: CN 2-12 intact. Pain and light touch intact in extremities.  Symmetrical.  Speech is fluent. Motor exam as listed above. Psychiatric: Judgment intact, Mood & affect appropriate for pt's clinical situation. Dermatologic: No rashes or ulcers noted.  No cellulitis or open wounds. Lymph : No Cervical, Axillary, or Inguinal lymphadenopathy.  Radiology No results  found.  Labs No results found for this or any previous visit (from the past 2160 hour(s)).  I have reviewed his primary care physicians last office note.  Assessment/Plan:  Atherosclerosis of coronary artery of native heart without angina pectoris S/p CABG  Hyperlipidemia lipid control important in reducing the progression of atherosclerotic disease. Continue statin therapy   Essential hypertension blood pressure control important in reducing the progression of atherosclerotic disease. On appropriate oral medications.   Swelling of limb  The patient has lower extremity symptoms that are not entirely clear in their etiology. Given his history of atherosclerotic coronary artery disease and multiple atherosclerotic risk factors including advanced age, peripheral arterial disease can certainly be contributing. The swelling and discoloration of the leg and which is saphenous vein was removed for coronary bypass also would suggest some  venous insufficiency. I would recommend checking both systems with noninvasive studies in the near future at his convenience. I have discussed the pathophysiology and natural history of both arterial and venous disease of the lower extremities. I will see the patient back following his noninvasive studies to discuss the results and determine further treatment options.  Pain in limb  The patient has lower extremity symptoms that are not entirely clear in their etiology. Given his history of atherosclerotic coronary artery disease and multiple atherosclerotic risk factors including advanced age, peripheral arterial disease can certainly be contributing. The swelling and discoloration of the leg and which is saphenous vein was removed for coronary bypass also would suggest some venous insufficiency. I would recommend checking both systems with noninvasive studies in the near future at his convenience. I have discussed the pathophysiology and natural history of both  arterial and venous disease of the lower extremities. I will see the patient back following his noninvasive studies to discuss the results and determine further treatment options.      Festus Barren 05/12/2016, 11:29 AM   This note was created with Dragon medical transcription system.  Any errors from dictation are unintentional.

## 2016-05-12 NOTE — Assessment & Plan Note (Signed)
  The patient has lower extremity symptoms that are not entirely clear in their etiology. Given his history of atherosclerotic coronary artery disease and multiple atherosclerotic risk factors including advanced age, peripheral arterial disease can certainly be contributing. The swelling and discoloration of the leg and which is saphenous vein was removed for coronary bypass also would suggest some venous insufficiency. I would recommend checking both systems with noninvasive studies in the near future at his convenience. I have discussed the pathophysiology and natural history of both arterial and venous disease of the lower extremities. I will see the patient back following his noninvasive studies to discuss the results and determine further treatment options. 

## 2016-05-12 NOTE — Patient Instructions (Signed)
Peripheral Vascular Disease Peripheral vascular disease (PVD) is a disease of the blood vessels that are not part of your heart and brain. A simple term for PVD is poor circulation. In most cases, PVD narrows the blood vessels that carry blood from your heart to the rest of your body. This can result in a decreased supply of blood to your arms, legs, and internal organs, like your stomach or kidneys. However, it most often affects a person's lower legs and feet. There are two types of PVD.  Organic PVD. This is the more common type. It is caused by damage to the structure of blood vessels.  Functional PVD. This is caused by conditions that make blood vessels contract and tighten (spasm). Without treatment, PVD tends to get worse over time. PVD can also lead to acute ischemic limb. This is when an arm or limb suddenly has trouble getting enough blood. This is a medical emergency. CAUSES Each type of PVD has many different causes. The most common cause of PVD is buildup of a fatty material (plaque) inside of your arteries (atherosclerosis). Small amounts of plaque can break off from the walls of the blood vessels and become lodged in a smaller artery. This blocks blood flow and can cause acute ischemic limb. Other common causes of PVD include:  Blood clots that form inside of blood vessels.  Injuries to blood vessels.  Diseases that cause inflammation of blood vessels or cause blood vessel spasms.  Health behaviors and health history that increase your risk of developing PVD. RISK FACTORS  You may have a greater risk of PVD if you:  Have a family history of PVD.  Have certain medical conditions, including:  High cholesterol.  Diabetes.  High blood pressure (hypertension).  Coronary heart disease.  Past problems with blood clots.  Past injury, such as burns or a broken bone. These may have damaged blood vessels in your limbs.  Buerger disease. This is caused by inflamed blood  vessels in your hands and feet.  Some forms of arthritis.  Rare birth defects that affect the arteries in your legs.  Use tobacco.  Do not get enough exercise.  Are obese.  Are age 50 or older. SIGNS AND SYMPTOMS  PVD may cause many different symptoms. Your symptoms depend on what part of your body is not getting enough blood. Some common signs and symptoms include:  Cramps in your lower legs. This may be a symptom of poor leg circulation (claudication).  Pain and weakness in your legs while you are physically active that goes away when you rest (intermittent claudication).  Leg pain when at rest.  Leg numbness, tingling, or weakness.  Coldness in a leg or foot, especially when compared with the other leg.  Skin or hair changes. These can include:  Hair loss.  Shiny skin.  Pale or bluish skin.  Thick toenails.  Inability to get or maintain an erection (erectile dysfunction). People with PVD are more prone to developing ulcers and sores on their toes, feet, or legs. These may take longer than normal to heal. DIAGNOSIS Your health care provider may diagnose PVD from your signs and symptoms. The health care provider will also do a physical exam. You may have tests to find out what is causing your PVD and determine its severity. Tests may include:  Blood pressure recordings from your arms and legs and measurements of the strength of your pulses (pulse volume recordings).  Imaging studies using sound waves to take pictures of   the blood flow through your blood vessels (Doppler ultrasound).  Injecting a dye into your blood vessels before having imaging studies using:  X-rays (angiogram or arteriogram).  Computer-generated X-rays (CT angiogram).  A powerful electromagnetic field and a computer (magnetic resonance angiogram or MRA). TREATMENT Treatment for PVD depends on the cause of your condition and the severity of your symptoms. It also depends on your age. Underlying  causes need to be treated and controlled. These include long-lasting (chronic) conditions, such as diabetes, high cholesterol, and high blood pressure. You may need to first try making lifestyle changes and taking medicines. Surgery may be needed if these do not work. Lifestyle changes may include:  Quitting smoking.  Exercising regularly.  Following a low-fat, low-cholesterol diet. Medicines may include:  Blood thinners to prevent blood clots.  Medicines to improve blood flow.  Medicines to improve your blood cholesterol levels. Surgical procedures may include:  A procedure that uses an inflated balloon to open a blocked artery and improve blood flow (angioplasty).  A procedure to put in a tube (stent) to keep a blocked artery open (stent implant).  Surgery to reroute blood flow around a blocked artery (peripheral bypass surgery).  Surgery to remove dead tissue from an infected wound on the affected limb.  Amputation. This is surgical removal of the affected limb. This may be necessary in cases of acute ischemic limb that are not improved through medical or surgical treatments. HOME CARE INSTRUCTIONS  Take medicines only as directed by your health care provider.  Do not use any tobacco products, including cigarettes, chewing tobacco, or electronic cigarettes. If you need help quitting, ask your health care provider.  Lose weight if you are overweight, and maintain a healthy weight as directed by your health care provider.  Eat a diet that is low in fat and cholesterol. If you need help, ask your health care provider.  Exercise regularly. Ask your health care provider to suggest some good activities for you.  Use compression stockings or other mechanical devices as directed by your health care provider.  Take good care of your feet.  Wear comfortable shoes that fit well.  Check your feet often for any cuts or sores. SEEK MEDICAL CARE IF:  You have cramps in your legs  while walking.  You have leg pain when you are at rest.  You have coldness in a leg or foot.  Your skin changes.  You have erectile dysfunction.  You have cuts or sores on your feet that are not healing. SEEK IMMEDIATE MEDICAL CARE IF:  Your arm or leg turns cold and blue.  Your arms or legs become red, warm, swollen, painful, or numb.  You have chest pain or trouble breathing.  You suddenly have weakness in your face, arm, or leg.  You become very confused or lose the ability to speak.  You suddenly have a very bad headache or lose your vision.   This information is not intended to replace advice given to you by your health care provider. Make sure you discuss any questions you have with your health care provider.   Document Released: 08/27/2004 Document Revised: 08/10/2014 Document Reviewed: 12/28/2013 Elsevier Interactive Patient Education 2016 [employer]  

## 2016-05-12 NOTE — Assessment & Plan Note (Signed)
S/p CABG 

## 2016-05-12 NOTE — Assessment & Plan Note (Signed)
blood pressure control important in reducing the progression of atherosclerotic disease. On appropriate oral medications.  

## 2016-05-12 NOTE — Assessment & Plan Note (Signed)
  The patient has lower extremity symptoms that are not entirely clear in their etiology. Given his history of atherosclerotic coronary artery disease and multiple atherosclerotic risk factors including advanced age, peripheral arterial disease can certainly be contributing. The swelling and discoloration of the leg and which is saphenous vein was removed for coronary bypass also would suggest some venous insufficiency. I would recommend checking both systems with noninvasive studies in the near future at his convenience. I have discussed the pathophysiology and natural history of both arterial and venous disease of the lower extremities. I will see the patient back following his noninvasive studies to discuss the results and determine further treatment options.

## 2016-05-20 ENCOUNTER — Other Ambulatory Visit: Payer: Self-pay | Admitting: Pharmacist

## 2016-05-20 NOTE — Patient Outreach (Signed)
Outreach call to [name] regarding his request for follow up from the [facility] Medication Adherence Campaign. Left a HIPAA compliant message on the patient's voicemail.   Duanne Moron, PharmD Clinical Pharmacist [facility] Care Management [phone]

## 2016-06-01 ENCOUNTER — Ambulatory Visit (INDEPENDENT_AMBULATORY_CARE_PROVIDER_SITE_OTHER): Payer: Medicare PPO

## 2016-06-01 ENCOUNTER — Encounter (INDEPENDENT_AMBULATORY_CARE_PROVIDER_SITE_OTHER): Payer: Self-pay | Admitting: Vascular Surgery

## 2016-06-01 ENCOUNTER — Ambulatory Visit (INDEPENDENT_AMBULATORY_CARE_PROVIDER_SITE_OTHER): Payer: Medicare PPO | Admitting: Vascular Surgery

## 2016-06-01 ENCOUNTER — Other Ambulatory Visit (INDEPENDENT_AMBULATORY_CARE_PROVIDER_SITE_OTHER): Payer: Self-pay | Admitting: Vascular Surgery

## 2016-06-01 ENCOUNTER — Encounter (INDEPENDENT_AMBULATORY_CARE_PROVIDER_SITE_OTHER): Payer: Self-pay

## 2016-06-01 VITALS — BP 172/80 | HR 61 | Resp 16 | Ht 66.0 in | Wt 188.0 lb

## 2016-06-01 DIAGNOSIS — E785 Hyperlipidemia, unspecified: Secondary | ICD-10-CM | POA: Diagnosis not present

## 2016-06-01 DIAGNOSIS — M79605 Pain in left leg: Secondary | ICD-10-CM

## 2016-06-01 DIAGNOSIS — I10 Essential (primary) hypertension: Secondary | ICD-10-CM

## 2016-06-01 DIAGNOSIS — I890 Lymphedema, not elsewhere classified: Secondary | ICD-10-CM | POA: Diagnosis not present

## 2016-06-01 DIAGNOSIS — M7989 Other specified soft tissue disorders: Secondary | ICD-10-CM | POA: Diagnosis not present

## 2016-06-01 DIAGNOSIS — M79662 Pain in left lower leg: Secondary | ICD-10-CM | POA: Diagnosis not present

## 2016-06-01 NOTE — Progress Notes (Signed)
Subjective:    Patient ID: Dakota [name], male    DOB: 08-Jul-1926, 82 y.o.   MRN: [id number] Chief Complaint  Patient presents with  . Re-evaluation    Ultrasound follow up   Patient last seen on 05/12/16 for evaluation of swelling and significant cyanosis and discoloration particularly in the left foot. Today he presents to review vascular studies. The patient underwent an ABI which showed Right ABI: 1.06 and Left 0.99 (on 02/01/12, Right ABI: 0.97 and Left 0.92). 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 Patient does not wear compression stockings or elevate his legs.    Review of Systems  Constitutional: Negative.   HENT: Negative.   Eyes: Negative.   Respiratory: Negative.   Cardiovascular: Positive for leg swelling.  Gastrointestinal: Negative.   Endocrine: Negative.   Genitourinary: Negative.   Musculoskeletal: Negative.   Skin: Negative.   Allergic/Immunologic: Negative.   Neurological: Negative.   Hematological: Negative.   Psychiatric/Behavioral: Negative.        Objective:   Physical Exam Gen:  WD/WN, NAD Head: [place]/AT, No temporalis wasting. Prominent temp pulse not noted. Ear/Nose/Throat: Hearing grossly intact, nares w/o erythema or drainage, oropharynx w/o Erythema/Exudate Eyes: PERRLA, EOMI.  Neck: Supple, no nuchal rigidity.  No bruit or JVD.  Pulmonary:  Good air movement, clear to auscultation bilaterally.  Cardiac: Regular rate and rhythm with murmur present Vascular:  Vessel Right Left  Radial Palpable Palpable  Ulnar Palpable Palpable  Brachial Palpable Palpable  Carotid Palpable, without bruit Palpable, without bruit  Aorta Not palpable N/A  Femoral Palpable Palpable  Popliteal Palpable Palpable  PT 1+ Palpable Not Palpable  DP Trace Palpable 1+ Palpable   Gastrointestinal: soft, non-tender/non-distended. No guarding/reflex. No  masses, surgical incisions, or scars. Musculoskeletal: M/S 5/5 throughout.  Extremities without ischemic changes.  No deformity or atrophy. Minimal right lower extremity edema. 1-2+ left lower extremity edema. Some purplish discoloration is prominent throughout the forefoot on the left Neurologic: CN 2-12 intact. Pain and light touch intact in extremities.  Symmetrical.  Speech is fluent. Motor exam as listed above. Psychiatric: Judgment intact, Mood & affect appropriate for pt's clinical situation. Dermatologic: No rashes or ulcers noted.  No cellulitis or open wounds. Lymph : No Cervical, Axillary, or Inguinal lymphadenopathy.  BP (!) 172/80 (BP Location: Right Arm)   Pulse 61   Resp 16   Ht 5\' 6"  (1.676 m)   Wt 188 lb (85.3 kg)   BMI 30.34 kg/m   Past Medical History:  Diagnosis Date  . Gout   . Hyperlipidemia   . Hypertension    Social History   Social History  . Marital status: Widowed    Spouse name: N/A  . Number of children: N/A  . Years of education: N/A   Occupational History  . Not on file.   Social History Main Topics  . Smoking status: Former  . Smokeless tobacco: Never Used  . Alcohol use No  . Drug use: No  . Sexual activity: Not on file   Other Topics Concern  . Not on file   Social History Narrative  . No narrative on file   Past Surgical History:  Procedure Laterality Date  . CARDIAC VALVE REPLACEMENT    . EYE SURGERY     cataract  . HERNIA REPAIR     Family History  Problem Relation Age of Onset  .  Congestive Heart Failure Father    Allergies  Allergen Reactions  . Colchicine Shortness Of Breath  . Penicillin G Rash      Assessment & Plan:  Patient last seen on 05/12/16 for evaluation of swelling and significant cyanosis and discoloration particularly in the left foot. Today he presents to review vascular studies. The patient underwent an ABI which showed Right ABI: 1.06 and Left 0.99 (on 02/01/12, Right ABI: 0.97 and Left 0.92).  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 Patient does not wear compression stockings or elevate his legs.   1. Lymphedema - New Studies reviewed with the patient. I spent time discussing her duplex results, the difference between venous insufficiency vs lymphedema and why her swelling is not venous in origin. I discussed lymphedema and what symptoms it causes and how to manage them. The patient was encouraged to wear graduated compression stockings (20-30 mmHg) on a daily basis. The patient was instructed to begin wearing the stockings first thing in the morning and removing them in the evening. The patient was instructed specifically not to sleep in the stockings. Prescription given. In addition, behavioral modification including elevation during the day will be initiated. We discussed a lymphedema pump if conventional therapy goes not work. The patient was advised to follow up in three months after wearing her compression stockings daily with elevation. Information on compression stockings, lymphedema and the lymphedema pump was given to the patient. The patient was instructed to call the office in the interim if any worsening edema or ulcerations to the legs, feet or toes occurs. The patient expresses their understanding.  2. Hyperlipidemia, unspecified hyperlipidemia type - Stable On ASA for medical optimization. Encouraged good control as its slows the progression of atherosclerotic disease  3. Essential hypertension - Stable Encouraged good control as its slows the progression of atherosclerotic disease  Current Outpatient Prescriptions on File Prior to Visit  Medication Sig Dispense Refill  . albuterol (PROVENTIL HFA;VENTOLIN HFA) 108 (90 Base) MCG/ACT inhaler Inhale 2 puffs into the lungs every 6 (six) hours as needed for wheezing or shortness of breath. 1 Inhaler 0  .  allopurinol (ZYLOPRIM) 300 MG tablet Take 1 tablet by mouth 2 (two) times daily. Dr Lavenia Atlas    . aspirin EC 81 MG tablet Take 81 mg by mouth daily.    Marland Kitchen azithromycin (ZITHROMAX) 250 MG tablet 2 today then 1 a day for 4 days 6 tablet 0  . furosemide (LASIX) 20 MG tablet Take 1 tablet by mouth as needed. Dr Lady Gary    . hydroxychloroquine (PLAQUENIL) 200 MG tablet Take 2 tablets by mouth daily. Dr Lavenia Atlas    . metoprolol tartrate (LOPRESSOR) 25 MG tablet Take 1 tablet by mouth 2 (two) times daily. Dr Lady Gary    . pravastatin (PRAVACHOL) 10 MG tablet Take 1 tablet by mouth at bedtime. Dr Lady Gary    . triamcinolone cream (KENALOG) 0.1 % APPLY TO FACIAL RASH 2 TIMES DAILY FOR 1 WEEK, THEN DAILY 120 g 1   No current facility-administered medications on file prior to visit.     There are no Patient Instructions on file for this visit. No Follow-up on file.   Zoe Goonan A Wade Sigala, PA-C

## 2016-07-10 ENCOUNTER — Inpatient Hospital Stay: Payer: Medicare PPO

## 2016-07-10 ENCOUNTER — Emergency Department: Payer: Medicare PPO

## 2016-07-10 ENCOUNTER — Inpatient Hospital Stay
Admission: EM | Admit: 2016-07-10 | Discharge: 2016-07-11 | DRG: 064 | Disposition: A | Discharge status: Home / Self Care | Payer: Medicare PPO | Attending: Internal Medicine | Admitting: Internal Medicine

## 2016-07-10 DIAGNOSIS — J209 Acute bronchitis, unspecified: Secondary | ICD-10-CM

## 2016-07-10 DIAGNOSIS — R0600 Dyspnea, unspecified: Secondary | ICD-10-CM

## 2016-07-10 DIAGNOSIS — Z953 Presence of xenogenic heart valve: Secondary | ICD-10-CM | POA: Diagnosis not present

## 2016-07-10 DIAGNOSIS — I639 Cerebral infarction, unspecified: Principal | ICD-10-CM | POA: Diagnosis present

## 2016-07-10 DIAGNOSIS — I10 Essential (primary) hypertension: Secondary | ICD-10-CM | POA: Diagnosis present

## 2016-07-10 DIAGNOSIS — M109 Gout, unspecified: Secondary | ICD-10-CM | POA: Diagnosis present

## 2016-07-10 DIAGNOSIS — Z7982 Long term (current) use of aspirin: Secondary | ICD-10-CM | POA: Diagnosis not present

## 2016-07-10 DIAGNOSIS — M069 Rheumatoid arthritis, unspecified: Secondary | ICD-10-CM | POA: Diagnosis present

## 2016-07-10 DIAGNOSIS — J441 Chronic obstructive pulmonary disease with (acute) exacerbation: Secondary | ICD-10-CM | POA: Diagnosis present

## 2016-07-10 DIAGNOSIS — J690 Pneumonitis due to inhalation of food and vomit: Secondary | ICD-10-CM | POA: Diagnosis present

## 2016-07-10 DIAGNOSIS — Z880 Allergy status to penicillin: Secondary | ICD-10-CM

## 2016-07-10 DIAGNOSIS — Z8249 Family history of ischemic heart disease and other diseases of the circulatory system: Secondary | ICD-10-CM

## 2016-07-10 DIAGNOSIS — N289 Disorder of kidney and ureter, unspecified: Secondary | ICD-10-CM | POA: Diagnosis present

## 2016-07-10 DIAGNOSIS — I890 Lymphedema, not elsewhere classified: Secondary | ICD-10-CM | POA: Diagnosis present

## 2016-07-10 DIAGNOSIS — Z952 Presence of prosthetic heart valve: Secondary | ICD-10-CM

## 2016-07-10 DIAGNOSIS — E785 Hyperlipidemia, unspecified: Secondary | ICD-10-CM | POA: Diagnosis present

## 2016-07-10 DIAGNOSIS — I2510 Atherosclerotic heart disease of native coronary artery without angina pectoris: Secondary | ICD-10-CM | POA: Diagnosis present

## 2016-07-10 DIAGNOSIS — I6509 Occlusion and stenosis of unspecified vertebral artery: Secondary | ICD-10-CM

## 2016-07-10 DIAGNOSIS — I959 Hypotension, unspecified: Secondary | ICD-10-CM | POA: Diagnosis present

## 2016-07-10 DIAGNOSIS — F802 Mixed receptive-expressive language disorder: Secondary | ICD-10-CM | POA: Diagnosis present

## 2016-07-10 DIAGNOSIS — I350 Nonrheumatic aortic (valve) stenosis: Secondary | ICD-10-CM | POA: Diagnosis present

## 2016-07-10 DIAGNOSIS — R0602 Shortness of breath: Secondary | ICD-10-CM

## 2016-07-10 DIAGNOSIS — Z951 Presence of aortocoronary bypass graft: Secondary | ICD-10-CM | POA: Diagnosis not present

## 2016-07-10 DIAGNOSIS — R2681 Unsteadiness on feet: Secondary | ICD-10-CM | POA: Diagnosis present

## 2016-07-10 DIAGNOSIS — R8281 Pyuria: Secondary | ICD-10-CM

## 2016-07-10 DIAGNOSIS — Z7952 Long term (current) use of systemic steroids: Secondary | ICD-10-CM

## 2016-07-10 DIAGNOSIS — Z87891 Personal history of nicotine dependence: Secondary | ICD-10-CM

## 2016-07-10 DIAGNOSIS — N390 Urinary tract infection, site not specified: Secondary | ICD-10-CM | POA: Diagnosis present

## 2016-07-10 DIAGNOSIS — J440 Chronic obstructive pulmonary disease with acute lower respiratory infection: Secondary | ICD-10-CM | POA: Diagnosis present

## 2016-07-10 DIAGNOSIS — R4701 Aphasia: Secondary | ICD-10-CM | POA: Diagnosis present

## 2016-07-10 DIAGNOSIS — I63512 Cerebral infarction due to unspecified occlusion or stenosis of left middle cerebral artery: Secondary | ICD-10-CM | POA: Diagnosis not present

## 2016-07-10 DIAGNOSIS — Z888 Allergy status to other drugs, medicaments and biological substances status: Secondary | ICD-10-CM

## 2016-07-10 DIAGNOSIS — M6281 Muscle weakness (generalized): Secondary | ICD-10-CM

## 2016-07-10 DIAGNOSIS — Z79899 Other long term (current) drug therapy: Secondary | ICD-10-CM

## 2016-07-10 DIAGNOSIS — I6529 Occlusion and stenosis of unspecified carotid artery: Secondary | ICD-10-CM

## 2016-07-10 HISTORY — DX: Nonrheumatic aortic (valve) stenosis: I35.0

## 2016-07-10 HISTORY — DX: Atherosclerotic heart disease of native coronary artery without angina pectoris: I25.10

## 2016-07-10 HISTORY — DX: Presence of prosthetic heart valve: Z95.2

## 2016-07-10 HISTORY — DX: Anemia, unspecified: D64.9

## 2016-07-10 HISTORY — DX: Rheumatoid arthritis, unspecified: M06.9

## 2016-07-10 LAB — URINALYSIS, COMPLETE (UACMP) WITH MICROSCOPIC
Bilirubin Urine: NEGATIVE
GLUCOSE, UA: NEGATIVE mg/dL
Hgb urine dipstick: NEGATIVE
KETONES UR: NEGATIVE mg/dL
Nitrite: NEGATIVE
PH: 6 (ref 5.0–8.0)
PROTEIN: 100 mg/dL — AB
SQUAMOUS EPITHELIAL / LPF: NONE SEEN
Specific Gravity, Urine: 1.018 (ref 1.005–1.030)

## 2016-07-10 LAB — CBC WITH DIFFERENTIAL/PLATELET
Basophils Absolute: 0.1 10*3/uL (ref 0–0.1)
Basophils Relative: 1 %
EOS ABS: 0.4 10*3/uL (ref 0–0.7)
Eosinophils Relative: 5 %
HEMATOCRIT: 38.7 % — AB (ref 40.0–52.0)
HEMOGLOBIN: 12.5 g/dL — AB (ref 13.0–18.0)
LYMPHS ABS: 1.3 10*3/uL (ref 1.0–3.6)
Lymphocytes Relative: 15 %
MCH: 30.5 pg (ref 26.0–34.0)
MCHC: 32.3 g/dL (ref 32.0–36.0)
MCV: 94.3 fL (ref 80.0–100.0)
MONOS PCT: 11 %
Monocytes Absolute: 1 10*3/uL (ref 0.2–1.0)
NEUTROS ABS: 5.9 10*3/uL (ref 1.4–6.5)
NEUTROS PCT: 68 %
Platelets: 153 10*3/uL (ref 150–440)
RBC: 4.1 MIL/uL — AB (ref 4.40–5.90)
RDW: 16.6 % — ABNORMAL HIGH (ref 11.5–14.5)
WBC: 8.6 10*3/uL (ref 3.8–10.6)

## 2016-07-10 LAB — PROTIME-INR
INR: 0.98
Prothrombin Time: 13 seconds (ref 11.4–15.2)

## 2016-07-10 LAB — TROPONIN I

## 2016-07-10 LAB — BASIC METABOLIC PANEL
Anion gap: 5 (ref 5–15)
BUN: 15 mg/dL (ref 6–20)
CALCIUM: 8.7 mg/dL — AB (ref 8.9–10.3)
CHLORIDE: 105 mmol/L (ref 101–111)
CO2: 28 mmol/L (ref 22–32)
CREATININE: 1.06 mg/dL (ref 0.61–1.24)
GFR calc non Af Amer: >60 mL/min (ref >60)
Glucose, Bld: 92 mg/dL (ref 65–99)
Potassium: 4.9 mmol/L (ref 3.5–5.1)
SODIUM: 138 mmol/L (ref 135–145)

## 2016-07-10 LAB — ETHANOL: Alcohol, Ethyl (B): <5 mg/dL (ref <5)

## 2016-07-10 LAB — APTT: aPTT: 30 seconds (ref 24–36)

## 2016-07-10 LAB — TSH: TSH: 1.961 u[IU]/mL (ref 0.350–4.500)

## 2016-07-10 MED ORDER — ENOXAPARIN SODIUM 40 MG/0.4ML ~~LOC~~ SOLN
40.0000 mg | SUBCUTANEOUS | Status: DC
  Administered 2016-07-10: 40 mg via SUBCUTANEOUS
  Filled 2016-07-10: qty 0.4

## 2016-07-10 MED ORDER — ONDANSETRON HCL 4 MG PO TABS: 4.0000 mg | ORAL_TABLET | Freq: Four times a day (QID) | ORAL | Status: DC | PRN

## 2016-07-10 MED ORDER — METOPROLOL TARTRATE 25 MG PO TABS
25.0000 mg | ORAL_TABLET | Freq: Two times a day (BID) | ORAL | Status: DC
  Administered 2016-07-10: 25 mg via ORAL
  Filled 2016-07-10: qty 1

## 2016-07-10 MED ORDER — ONDANSETRON HCL 4 MG/2ML IJ SOLN: 4.0000 mg | Freq: Four times a day (QID) | INTRAMUSCULAR | Status: DC | PRN

## 2016-07-10 MED ORDER — SODIUM CHLORIDE 0.9% FLUSH
3.0000 mL | Freq: Two times a day (BID) | INTRAVENOUS | Status: DC
  Administered 2016-07-10 – 2016-07-11 (×2): 3 mL via INTRAVENOUS

## 2016-07-10 MED ORDER — METHYLPREDNISOLONE SODIUM SUCC 125 MG IJ SOLR
60.0000 mg | INTRAMUSCULAR | Status: DC
  Administered 2016-07-10: 18:00:00 60 mg via INTRAVENOUS
  Filled 2016-07-10: qty 2

## 2016-07-10 MED ORDER — IOPAMIDOL (ISOVUE-370) INJECTION 76%
75.0000 mL | Freq: Once | INTRAVENOUS | Status: AC | PRN
  Administered 2016-07-10: 75 mL via INTRAVENOUS

## 2016-07-10 MED ORDER — ACETAMINOPHEN 325 MG PO TABS: 650.0000 mg | ORAL_TABLET | Freq: Four times a day (QID) | ORAL | Status: DC | PRN

## 2016-07-10 MED ORDER — ATORVASTATIN CALCIUM 20 MG PO TABS
40.0000 mg | ORAL_TABLET | Freq: Every day | ORAL | Status: DC
  Administered 2016-07-10: 18:00:00 40 mg via ORAL
  Filled 2016-07-10: qty 2

## 2016-07-10 MED ORDER — IPRATROPIUM-ALBUTEROL 0.5-2.5 (3) MG/3ML IN SOLN
3.0000 mL | RESPIRATORY_TRACT | Status: DC
Start: 2016-07-10 — End: 2016-07-11
  Administered 2016-07-10 – 2016-07-11 (×5): 3 mL via RESPIRATORY_TRACT
  Filled 2016-07-10 (×6): qty 3

## 2016-07-10 MED ORDER — CLOPIDOGREL BISULFATE 75 MG PO TABS
75.0000 mg | ORAL_TABLET | Freq: Every day | ORAL | Status: DC
  Administered 2016-07-10 – 2016-07-11 (×2): 75 mg via ORAL
  Filled 2016-07-10 (×2): qty 1

## 2016-07-10 MED ORDER — FUROSEMIDE 10 MG/ML IJ SOLN
40.0000 mg | Freq: Every day | INTRAMUSCULAR | Status: DC
  Administered 2016-07-10: 18:00:00 40 mg via INTRAVENOUS
  Filled 2016-07-10: qty 4

## 2016-07-10 MED ORDER — ACETAMINOPHEN 650 MG RE SUPP: 650.0000 mg | Freq: Four times a day (QID) | RECTAL | Status: DC | PRN

## 2016-07-10 MED ORDER — ASPIRIN EC 81 MG PO TBEC
81.0000 mg | DELAYED_RELEASE_TABLET | Freq: Every day | ORAL | Status: DC
Start: 2016-07-10 — End: 2016-07-11
  Administered 2016-07-10 – 2016-07-11 (×2): 81 mg via ORAL
  Filled 2016-07-10 (×2): qty 1

## 2016-07-10 MED ORDER — HYDROXYCHLOROQUINE SULFATE 200 MG PO TABS
400.0000 mg | ORAL_TABLET | Freq: Every day | ORAL | Status: DC
  Administered 2016-07-11: 09:00:00 400 mg via ORAL
  Filled 2016-07-10: qty 2

## 2016-07-10 NOTE — H&P (Signed)
[facility] - [place] [facility]   PATIENT NAME: Dakota [name]    MR#:  [id number]  DATE OF BIRTH:  February 17, 1926  DATE OF ADMISSION:  07/10/2016  PRIMARY CARE PHYSICIAN: Elizabeth Sauer, MD   REQUESTING/REFERRING PHYSICIAN: Dr. Lacretia Nicks  CHIEF COMPLAINT:   Chief Complaint  Patient presents with  . Altered Mental Status    HISTORY OF PRESENT ILLNESS:  Dakota [name]  is a 82 y.o. male with a known history of CAD status post bypass graft surgery, aortic valve replacement with bioprosthetic valve, hypertension, rheumatoid arthritis, anemia, hyperlipidemia presents from home secondary to change in speech going on for 3 days. Patient has significant expressive aphasia and most of the history is obtained from his significant other at bedside. Patient was noted to be slumped over beside his bed 3 days ago. He was helped back to bed. That evening the family has noticed that his speech was changed. They thought initially he was confused as he was saying things that doesn't make sense. Yesterday they have realized that he is frustrated because he notes he wants to say but can't get the words right. On his daughter's insistence, his significant other has brought him to the ER today. CT of the head here shows possible subacute infarct in the left temporal region. Patient is being admitted for stroke causing his aphasia. He is also noted to have expiratory wheezes on exam. According to his girlfriend, no changes in swallowing. Patient has unsteady gait at baseline, so they did not notice any change in his gait.  PAST MEDICAL HISTORY:   Past Medical History:  Diagnosis Date  . Anemia   . Aortic stenosis   . CAD (coronary artery disease)    s/p CABG  . Gout   . H/O aortic valve replacement   . Hyperlipidemia   . Hypertension   . Rheumatoid arthritis (HCC)     PAST SURGICAL HISTORY:   Past Surgical History:  Procedure Laterality Date  . AORTIC VALVE REPLACEMENT  (AVR)/CORONARY ARTERY BYPASS GRAFTING (CABG)    . CARDIAC VALVE REPLACEMENT    . CORONARY ARTERY BYPASS GRAFT    . EYE SURGERY     cataract  . forearm tendon surgery Right   . HERNIA REPAIR    . TONSILLECTOMY      SOCIAL HISTORY:   Social History  Substance Use Topics  . Smoking status: Former Games developer  . Smokeless tobacco: Never Used     Comment: Quit about 25 hyears ago  . Alcohol use No    FAMILY HISTORY:   Family History  Problem Relation Age of Onset  . Congestive Heart Failure Father     DRUG ALLERGIES:   Allergies  Allergen Reactions  . Colchicine Shortness Of Breath  . Penicillin G Rash    REVIEW OF SYSTEMS:   Review of Systems  Constitutional: Positive for malaise/fatigue. Negative for chills, fever and weight loss.  HENT: Positive for hearing loss. Negative for ear discharge, ear pain, nosebleeds and tinnitus.   Eyes: Positive for blurred vision. Negative for double vision and photophobia.  Respiratory: Positive for shortness of breath. Negative for cough, hemoptysis and wheezing.   Cardiovascular: Negative for chest pain, palpitations, orthopnea and leg swelling.  Gastrointestinal: Negative for abdominal pain, constipation, diarrhea, heartburn, melena, nausea and vomiting.  Genitourinary: Negative for dysuria, frequency, hematuria and urgency.  Musculoskeletal: Negative for back pain, myalgias and neck pain.  Skin: Negative for rash.  Neurological: Positive for speech change. Negative  for dizziness, tingling, tremors, sensory change, focal weakness and headaches.  Endo/Heme/Allergies: Does not bruise/bleed easily.  Psychiatric/Behavioral: Negative for depression.    MEDICATIONS AT HOME:   Prior to Admission medications   Medication Sig Start Date End Date Taking? Authorizing Provider  albuterol (PROVENTIL HFA;VENTOLIN HFA) 108 (90 Base) MCG/ACT inhaler Inhale 2 puffs into the lungs every 6 (six) hours as needed for wheezing or shortness of breath.  05/01/16  Yes Duanne Limerick, MD  allopurinol (ZYLOPRIM) 300 MG tablet Take 1 tablet by mouth 2 (two) times daily. Dr Lavenia Atlas 09/18/15  Yes Historical Provider, MD  aspirin EC 81 MG tablet Take 81 mg by mouth daily.   Yes Historical Provider, MD  furosemide (LASIX) 20 MG tablet Take 1 tablet by mouth as needed. Dr Lady Gary 05/15/14  Yes Historical Provider, MD  hydroxychloroquine (PLAQUENIL) 200 MG tablet Take 2 tablets by mouth daily. Dr Lavenia Atlas 09/18/15  Yes Historical Provider, MD  metoprolol tartrate (LOPRESSOR) 25 MG tablet Take 1 tablet by mouth 2 (two) times daily. Dr Lady Gary 09/18/15  Yes Historical Provider, MD  pravastatin (PRAVACHOL) 10 MG tablet Take 1 tablet by mouth at bedtime. Dr Lady Gary 01/27/16  Yes Historical Provider, MD  predniSONE (DELTASONE) 5 MG tablet Take 5 mg by mouth daily as needed.   Yes Historical Provider, MD  triamcinolone cream (KENALOG) 0.1 % APPLY TO FACIAL RASH 2 TIMES DAILY FOR 1 WEEK, THEN DAILY 01/20/16  Yes Duanne Limerick, MD      VITAL SIGNS:  Blood pressure (!) 150/82, pulse 70, temperature 98.3 F (36.8 C), temperature source Oral, resp. rate 12, height 5\' 9"  (1.753 m), SpO2 96 %.  PHYSICAL EXAMINATION:   Physical Exam  GENERAL:  82 y.o.-year-old patient lying in the bed with no acute distress.  EYES: Pupils equal, round, reactive to light and accommodation. No scleral icterus. Extraocular muscles intact.  HEENT: Head atraumatic, normocephalic. Oropharynx and nasopharynx clear.  NECK:  Supple, no jugular venous distention. No thyroid enlargement, no tenderness.  LUNGS: Diffuse expiratory wheezes noted all over the lung fields, no rales,rhonchi or crepitation. No use of accessory muscles of respiration.  CARDIOVASCULAR: S1, S2 normal. No murmurs, rubs, or gallops.  ABDOMEN: Soft, nontender, nondistended. Bowel sounds present. No organomegaly or mass.  EXTREMITIES: No pedal edema, cyanosis, or clubbing.  NEUROLOGIC: Cranial nerves II through XII are  intact. Has significant expressive aphasia, Muscle strength 5/5 in all extremities. Sensation intact. Gait not checked.  PSYCHIATRIC: The patient is alert and oriented.  SKIN: No obvious rash, lesion, or ulcer.   LABORATORY PANEL:   CBC  Recent Labs Lab 07/10/16 1157  WBC 8.6  HGB 12.5*  HCT 38.7*  PLT 153   ------------------------------------------------------------------------------------------------------------------  Chemistries  No results for input(s): NA, K, CL, CO2, GLUCOSE, BUN, CREATININE, CALCIUM, MG, AST, ALT, ALKPHOS, BILITOT in the last 168 hours.  Invalid input(s): GFRCGP ------------------------------------------------------------------------------------------------------------------  Cardiac Enzymes  Recent Labs Lab 07/10/16 1157  TROPONINI <0.03   ------------------------------------------------------------------------------------------------------------------  RADIOLOGY:  Ct Head Wo Contrast  Result Date: 07/10/2016 CLINICAL DATA:  Altered mental status for 4 days.  No known injury. EXAM: CT HEAD WITHOUT CONTRAST TECHNIQUE: Contiguous axial images were obtained from the base of the skull through the vertex without intravenous contrast. COMPARISON:  None. FINDINGS: Brain: Hypoattenuation is seen in the anterior aspect of the left temporal lobe with an appearance most suspicious for subacute infarct. 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 The brain is atrophic with chronic microvascular ischemic change. Remote  lacunar infarction in the left thalamus is identified. No hemorrhage, midline shift or abnormal extra-axial fluid collection is seen. Vascular: Atherosclerosis noted.  No hyperdense vessel identified. Skull: Intact. Sinuses/Orbits: Small amount of secretions in the left sphenoid sinus noted. Other: None. IMPRESSION: Hypoattenuation in the anterior left temporal lobe most consistent with early subacute to subacute infarct. Atrophy and chronic  microvascular ischemic change. Remote right cerebellar infarct noted. Atherosclerosis. Electronically Signed   By: Drusilla Kanner M.D.   On: 07/10/2016 12:31    EKG:   Orders placed or performed during the hospital encounter of 07/10/16  . EKG 12-Lead  . EKG 12-Lead  . ED EKG  . ED EKG    IMPRESSION AND PLAN:   Dakota [name]  is a 83 y.o. male with a known history of CAD status post bypass graft surgery, aortic valve replacement with bioprosthetic valve, hypertension, rheumatoid arthritis, anemia, hyperlipidemia presents from home secondary to change in speech going on for 3 days.  #1 acute CVA-possible early subacute infarct in the anterior left temporal lobe on CT head. -Admit, monitor on telemetry, neuro checks and neurology consult. - MRI of the brain and MRA of the head. Carotid Dopplers and echocardiogram ordered. -Already on aspirin, continue and also add Plavix. Change the statin to high intensity statin. -Physical therapy, occupational therapy and speech therapy consults requested. -Check A1c and lipid profile  #2 hypertension-continue his home medications. Patient on metoprolol as an outpatient.  #3 rheumatoid arthritis-follows with the rheumatologist as outpatient. Continue outpatient medications.  #4 aortic valve replacement surgery-status post bioprosthetic valve. History of A. fib in remote past but none recently according to his cardiologist. Not on any anticoagulation.  #5 DVT prophylaxis-on Lovenox  #6 reactive airway disease/COPD-remote history of smoking. No known history of COPD. -Added nebulizer treatments. Also continue Lasix. Also started Solu-Medrol daily for now.    All the records are reviewed and case discussed with ED provider. Management plans discussed with the patient, family and they are in agreement.  CODE STATUS: Full code  TOTAL TIME TAKING CARE OF THIS PATIENT: 50 minutes.    Enid Baas M.D on 07/10/2016 at 2:36 PM  Between  7am to 6pm - Pager - [phone]  After 6pm go to [email or web address] - Social research officer, government  [facility]  Office  [phone]  CC: Primary care physician; Elizabeth Sauer, MD

## 2016-07-10 NOTE — ED Notes (Signed)
Patient transported to Ultrasound 

## 2016-07-10 NOTE — Consult Note (Signed)
Referring Physician: Nemiah Commander    Chief Complaint: Difficulty with speech  HPI: Dakota [name] is an 82 y.o. male who awakened on Wednesday and was unable to communicate properly.  He was able to walk, feed himself and use the bathroom but seemed confused.  Was advised to go the ED but since at times he seemed to be better they waited to come.  Brought to the ED today since ne was not back to baseline.  Initial NIHSS of 3.  Date last known well: Date: 07/07/2016 Time last known well: Time: 21:00 tPA Given: No: Outside time window  Past Medical History:  Diagnosis Date  . Anemia   . Aortic stenosis   . CAD (coronary artery disease)    s/p CABG  . Gout   . H/O aortic valve replacement   . Hyperlipidemia   . Hypertension   . Rheumatoid arthritis [facility])     Past Surgical History:  Procedure Laterality Date  . AORTIC VALVE REPLACEMENT (AVR)/CORONARY ARTERY BYPASS GRAFTING (CABG)    . CARDIAC VALVE REPLACEMENT    . CORONARY ARTERY BYPASS GRAFT    . EYE SURGERY     cataract  . forearm tendon surgery Right   . HERNIA REPAIR    . TONSILLECTOMY      Family History  Problem Relation Age of Onset  . Congestive Heart Failure Father    Social History:  reports that he has quit smoking. He has never used smokeless tobacco. He reports that he does not drink alcohol or use drugs.  Allergies:  Allergies  Allergen Reactions  . Colchicine Shortness Of Breath  . Penicillin G Rash    Medications:  I have reviewed the patient's current medications. Prior to Admission:  (Not in a hospital admission) Scheduled: . atorvastatin  40 mg Oral q1800  . clopidogrel  75 mg Oral Daily  . furosemide  40 mg Intravenous Daily  . ipratropium-albuterol  3 mL Nebulization Q4H  . methylPREDNISolone (SOLU-MEDROL) injection  60 mg Intravenous Q24H    ROS: History obtained from the patient and daughter  General ROS: negative for - chills, fatigue, fever, night sweats, weight gain or weight  loss Psychological ROS: negative for - behavioral disorder, hallucinations, memory difficulties, mood swings or suicidal ideation Ophthalmic ROS: poor vision right eye ENT ROS: HOH Allergy and Immunology ROS: negative for - hives or itchy/watery eyes Hematological and Lymphatic ROS: negative for - bleeding problems, bruising or swollen lymph nodes Endocrine ROS: negative for - galactorrhea, hair pattern changes, polydipsia/polyuria or temperature intolerance Respiratory ROS: negative for - cough, hemoptysis, shortness of breath or wheezing Cardiovascular ROS: negative for - chest pain, dyspnea on exertion, edema or irregular heartbeat Gastrointestinal ROS: negative for - abdominal pain, diarrhea, hematemesis, nausea/vomiting or stool incontinence Genito-Urinary ROS: negative for - dysuria, hematuria, incontinence or urinary frequency/urgency Musculoskeletal ROS: negative for - joint swelling or muscular weakness Neurological ROS: as noted in HPI Dermatological ROS: bruising  Physical Examination: Blood pressure (!) 150/82, pulse 70, temperature 98.3 F (36.8 C), temperature source Oral, resp. rate 12, height 5\' 9"  (1.753 m), SpO2 96 %.  HEENT-  Normocephalic, no lesions, without obvious abnormality.  Normal external eye and conjunctiva.  Normal TM's bilaterally.  Normal auditory canals and external ears. Normal external nose, mucus membranes and septum.  Normal pharynx. Cardiovascular- S1, S2 normal, pulses palpable throughout   Lungs- chest clear, no wheezing, rales, normal symmetric air entry Abdomen- soft, non-tender; bowel sounds normal; no masses,  no organomegaly Extremities- mild  LE edema Lymph-no adenopathy palpable Musculoskeletal-no joint tenderness, deformity or swelling Skin-warm and dry, no hyperpigmentation, vitiligo, or suspicious lesions  Neurological Examination Mental Status: Alert.  Expressive and receptive aphasia noted with patient able to get some spontaneous  fluent speech out at times.  Needs nonverbal cues to follow commands.  Cranial Nerves: II: Discs flat bilaterally; Does not blink to confrontation from the right, right pupil sluggishly reactive III,IV, VI: ptosis not present, extra-ocular motions intact bilaterally V,VII: smile symmetric, facial light touch sensation normal bilaterally VIII: hearing normal bilaterally IX,X: gag reflex present XI: bilateral shoulder shrug XII: midline tongue extension Motor: Right : Upper extremity   5/5    Left:     Upper extremity   5/5  Lower extremity   5/5     Lower extremity   5/5 Tone and bulk:normal tone throughout; no atrophy noted Sensory: Responds to light noxious stimuli throughout Deep Tendon Reflexes: 2+ and symmetric with absent AJ's bilaterally Plantars: Right: mute   Left: mute Cerebellar: Unable to perform due to patient's ability to follow commands Gait: not tested due to safety concerns   Laboratory Studies:  Basic Metabolic Panel: No results for input(s): NA, K, CL, CO2, GLUCOSE, BUN, CREATININE, CALCIUM, MG, PHOS in the last 168 hours.  Liver Function Tests: No results for input(s): AST, ALT, ALKPHOS, BILITOT, PROT, ALBUMIN in the last 168 hours. No results for input(s): LIPASE, AMYLASE in the last 168 hours. No results for input(s): AMMONIA in the last 168 hours.  CBC:  Recent Labs Lab 07/10/16 1157  WBC 8.6  NEUTROABS 5.9  HGB 12.5*  HCT 38.7*  MCV 94.3  PLT 153    Cardiac Enzymes:  Recent Labs Lab 07/10/16 1157  TROPONINI <0.03    BNP: Invalid input(s): POCBNP  CBG: No results for input(s): GLUCAP in the last 168 hours.  Microbiology: No results found for this or any previous visit.  Coagulation Studies:  Recent Labs  07/10/16 1157  LABPROT 13.0  INR 0.98    Urinalysis:  Recent Labs Lab 07/10/16 1159  COLORURINE YELLOW*  LABSPEC 1.018  PHURINE 6.0  GLUCOSEU NEGATIVE  HGBUR NEGATIVE  BILIRUBINUR NEGATIVE  KETONESUR NEGATIVE   PROTEINUR 100*  NITRITE NEGATIVE  LEUKOCYTESUR LARGE*    Lipid Panel: No results found for: CHOL, TRIG, HDL, CHOLHDL, VLDL, LDLCALC  HgbA1C: No results found for: HGBA1C  Urine Drug Screen:  No results found for: LABOPIA, COCAINSCRNUR, LABBENZ, AMPHETMU, THCU, LABBARB  Alcohol Level:  Recent Labs Lab 07/10/16 1157  ETH <5    Other results: EKG: junctional rhythm, 69 bpm.  Imaging: Ct Head Wo Contrast  Result Date: 07/10/2016 CLINICAL DATA:  Altered mental status for 4 days.  No known injury. EXAM: CT HEAD WITHOUT CONTRAST TECHNIQUE: Contiguous axial images were obtained from the base of the skull through the vertex without intravenous contrast. COMPARISON:  None. FINDINGS: Brain: Hypoattenuation is seen in the anterior aspect of the left temporal lobe with an appearance most suspicious for subacute infarct. 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 The brain is atrophic with chronic microvascular ischemic change. Remote lacunar infarction in the left thalamus is identified. No hemorrhage, midline shift or abnormal extra-axial fluid collection is seen. Vascular: Atherosclerosis noted.  No hyperdense vessel identified. Skull: Intact. Sinuses/Orbits: Small amount of secretions in the left sphenoid sinus noted. Other: None. IMPRESSION: Hypoattenuation in the anterior left temporal lobe most consistent with early subacute to subacute infarct. Atrophy and chronic microvascular ischemic change. Remote right cerebellar infarct noted. Atherosclerosis.  Electronically Signed   By: Drusilla Kanner M.D.   On: 07/10/2016 12:31    Assessment: 82 y.o. male presenting with expressive and receptive aphasia.  No focal weakness noted.  Head CT reviewed and shows an area of hypoattenuation in the left temporal lobe consistent with infarct.  Patient on Plavix.  Has an artifical valve.  Concern is for embolic etiology of infarct.  Further work up recommended.    Stroke Risk Factors - hyperlipidemia and  hypertension  Plan: 1. HgbA1c, fasting lipid panel 2. MRI, MRA  of the brain without contrast 3. PT consult, OT consult, Speech consult 4. Echocardiogram 5. Carotid dopplers 6. Prophylactic therapy-Antiplatelet med: Plavix - dose 75mg  daily to be added to ASA 7. NPO until RN stroke swallow screen 8. Telemetry monitoring 9. Frequent neuro checks   , MD Neurology [phone] 07/10/2016, 2:23 PM

## 2016-07-10 NOTE — ED Triage Notes (Signed)
Pt from home via ems, family reports AMS since Tuesday, reports altered speech. Pt unable to give hx

## 2016-07-10 NOTE — ED Notes (Signed)
Patient transported to CT 

## 2016-07-10 NOTE — ED Provider Notes (Signed)
Time Seen: Approximately 1157  I have reviewed the triage notes  Chief Complaint: Altered Mental Status   History of Present Illness: Dakota [name] is a 82 y.o. male who presents via EMS from home. Family reports patient's had an altered mental status since Tuesday. Further questioning per EMS was at patient's had some difficulty with speech. No other focal neurologic deficits. The patient himself has some aphasia and is unable to give any significant history or review of systems he does follow instructions and attempts to answer questions with sometimes adequate answers and sometimes word salad. No obvious focal weakness in either upper or lower extremities and from what I can ascertain he denies any headaches, visual disturbances, or any other concerns.   Past Medical History:  Diagnosis Date  . Anemia   . Aortic stenosis   . CAD (coronary artery disease)    s/p CABG  . Gout   . H/O aortic valve replacement   . Hyperlipidemia   . Hypertension   . Rheumatoid arthritis [facility])     Patient Active Problem List   Diagnosis Date Noted  . Lymphedema 06/01/2016  . Atherosclerosis of coronary artery of native heart without angina pectoris 05/12/2016  . Essential hypertension 05/12/2016  . Hyperlipidemia 05/12/2016  . Pain in limb 05/12/2016  . Swelling of limb 05/12/2016    Past Surgical History:  Procedure Laterality Date  . AORTIC VALVE REPLACEMENT (AVR)/CORONARY ARTERY BYPASS GRAFTING (CABG)    . CARDIAC VALVE REPLACEMENT    . CORONARY ARTERY BYPASS GRAFT    . EYE SURGERY     cataract  . forearm tendon surgery Right   . HERNIA REPAIR    . TONSILLECTOMY      Past Surgical History:  Procedure Laterality Date  . AORTIC VALVE REPLACEMENT (AVR)/CORONARY ARTERY BYPASS GRAFTING (CABG)    . CARDIAC VALVE REPLACEMENT    . CORONARY ARTERY BYPASS GRAFT    . EYE SURGERY     cataract  . forearm tendon surgery Right   . HERNIA REPAIR    . TONSILLECTOMY      Current  Outpatient Rx  . Order #: [id number] Class: Normal  . Order #: [id number] Class: Historical Med  . Order #: [id number] Class: Historical Med  . Order #: [id number] Class: Normal  . Order #: [id number] Class: Historical Med  . Order #: [id number] Class: Historical Med  . Order #: [id number] Class: Historical Med  . Order #: [id number] Class: Historical Med  . Order #: [id number] Class: Normal    Allergies:  Colchicine and Penicillin g  Family History: Family History  Problem Relation Age of Onset  . Congestive Heart Failure Father     Social History: Social History  Substance Use Topics  . Smoking status: Former Games developer  . Smokeless tobacco: Never Used     Comment: Quit about 25 hyears ago  . Alcohol use No     Review of Systems:   10 point review of systems was performed and was otherwise negative:  Constitutional: No fever Eyes: No visual disturbances ENT: No sore throat, ear pain Cardiac: No chest pain Respiratory: No shortness of breath, wheezing, or stridor Abdomen: No abdominal pain, no vomiting, No diarrhea Endocrine: No weight loss, No night sweats Extremities: No peripheral edema, cyanosis Skin: No rashes, easy bruising Neurologic: No focal weakness Or difficulty with swallowing Urologic: No dysuria, Hematuria, or urinary frequency   Physical Exam:  ED Triage Vitals  Enc Vitals Group     BP 07/10/16 1200 (!) 145/77     Pulse  Rate 07/10/16 1200 70     Resp 07/10/16 1200 (!) 21     Temp 07/10/16 1200 98.3 F (36.8 C)     Temp Source 07/10/16 1200 Oral     SpO2 07/10/16 1200 96 %     Weight --      Height 07/10/16 1153 5\' 9"  (1.753 m)     Head Circumference --      Peak Flow --      Pain Score --      Pain Loc --      Pain Edu? --      Excl. in GC? --     General: Awake , Alert , and Oriented times 3; GCS 15 Patient's able to follow commands and appears to be in no acute distress.  Head: Normal cephalic , atraumatic. No focal facial weakness Eyes: Pupils equal  , round, reactive to light Nose/Throat: No nasal drainage, patent upper airway without erythema or exudate.  Neck: Supple, Full range of motion, No anterior adenopathy or palpable thyroid masses Lungs: Clear to ascultation without wheezes , rhonchi, or rales Heart: Regular rate, regular rhythm without murmurs , gallops , or rubs Abdomen: Soft, non tender without rebound, guarding , or rigidity; bowel sounds positive and symmetric in all 4 quadrants. No organomegaly .        Extremities: 2 plus symmetric pulses. No edema, clubbing or cyanosis Neurologic: , No Motor symmetric without deficits, sensory intact. Negative drift.  Skin: warm, dry, no rashes   Labs:   All laboratory work was reviewed including any pertinent negatives or positives listed below:  Labs Reviewed  CBC WITH DIFFERENTIAL/PLATELET - Abnormal; Notable for the following:       Result Value   RBC 4.10 (*)    Hemoglobin 12.5 (*)    HCT 38.7 (*)    RDW 16.6 (*)    All other components within normal limits  URINALYSIS, COMPLETE (UACMP) WITH MICROSCOPIC - Abnormal; Notable for the following:    Color, Urine YELLOW (*)    APPearance HAZY (*)    Protein, ur 100 (*)    Leukocytes, UA LARGE (*)    Bacteria, UA RARE (*)    All other components within normal limits  URINE CULTURE  ETHANOL  PROTIME-INR  APTT  TROPONIN I    EKG: ED ECG REPORT I, , the attending physician, personally viewed and interpreted this ECG.  Date: 07/10/2016 EKG Time:1153 Rate: 69 Rhythm: normal sinus rhythm QRS Axis: normal Intervals: normal ST/T Wave abnormalities: normal Conduction Disturbances: none Narrative Interpretation: unremarkable Nonspecific intraventricular block   Radiology:  "Ct Head Wo Contrast  Result Date: 07/10/2016 CLINICAL DATA:  Altered mental status for 4 days.  No known injury. EXAM: CT HEAD WITHOUT CONTRAST TECHNIQUE: Contiguous axial images were obtained from the base of the skull through the  vertex without intravenous contrast. COMPARISON:  None. FINDINGS: Brain: Hypoattenuation is seen in the anterior aspect of the left temporal lobe with an appearance most suspicious for subacute infarct. 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 The brain is atrophic with chronic microvascular ischemic change. Remote lacunar infarction in the left thalamus is identified. No hemorrhage, midline shift or abnormal extra-axial fluid collection is seen. Vascular: Atherosclerosis noted.  No hyperdense vessel identified. Skull: Intact. Sinuses/Orbits: Small amount of secretions in the left sphenoid sinus noted. Other: None. IMPRESSION: Hypoattenuation in the anterior left temporal lobe most consistent with early subacute to subacute infarct. Atrophy and chronic microvascular ischemic change. Remote  right cerebellar infarct noted. Atherosclerosis. Electronically Signed   By: Drusilla Kanner M.D.   On: 07/10/2016 12:31  "   ED Course:  Patient has what appears to be an acute cerebrovascular accident with a presentation consistent with Wernicke's aphasia I felt this was unlikely to be acute delirium in the patient's level of attention is it present. He does have findings consistent with urinary tract infection. A urine culture has been added to his evaluation. I spoke to Dr. Thad Ranger on call for neurology and also spoke to the hospitalist team for general admission. Clinical Course      Assessment:  Acute cerebrovascular accident      Plan: Inpatient management            Jennye Moccasin, MD 07/10/16 1330

## 2016-07-10 NOTE — Progress Notes (Signed)
The CH responded to an order from the Pt in Rm 116A, who wanted to see a Ch, but when the [facility] arrived the Pt was with a family member in the Rm, Pt was medicated, and she did not want to be disturbed; Pt requested that [facility] visit her another day.     07/10/16 1900  Clinical Encounter Type  Visited With Patient;Patient and family together  Visit Type Initial;Spiritual support;Other (Comment)  Referral From Nurse  Consult/Referral To Chaplain  Spiritual Encounters  Spiritual Needs Prayer

## 2016-07-10 NOTE — ED Notes (Signed)
Per family, pt has had aphasia and dysarthria since approx Wednesday. No other deficits noted.

## 2016-07-10 NOTE — ED Notes (Signed)
Attempted to call report x2, Nurse and charge nurse busy at this time.

## 2016-07-11 ENCOUNTER — Inpatient Hospital Stay
Admission: EM | Admit: 2016-07-11 | Discharge: 2016-07-11 | Disposition: A | Discharge status: Home / Self Care | Payer: Medicare PPO | Source: Home / Self Care | Attending: Internal Medicine | Admitting: Internal Medicine

## 2016-07-11 DIAGNOSIS — J441 Chronic obstructive pulmonary disease with (acute) exacerbation: Secondary | ICD-10-CM

## 2016-07-11 DIAGNOSIS — R8281 Pyuria: Secondary | ICD-10-CM

## 2016-07-11 DIAGNOSIS — J209 Acute bronchitis, unspecified: Secondary | ICD-10-CM

## 2016-07-11 DIAGNOSIS — I63512 Cerebral infarction due to unspecified occlusion or stenosis of left middle cerebral artery: Secondary | ICD-10-CM

## 2016-07-11 DIAGNOSIS — I6529 Occlusion and stenosis of unspecified carotid artery: Secondary | ICD-10-CM

## 2016-07-11 DIAGNOSIS — N289 Disorder of kidney and ureter, unspecified: Secondary | ICD-10-CM

## 2016-07-11 DIAGNOSIS — R4701 Aphasia: Secondary | ICD-10-CM

## 2016-07-11 DIAGNOSIS — I6509 Occlusion and stenosis of unspecified vertebral artery: Secondary | ICD-10-CM

## 2016-07-11 LAB — HEMOGLOBIN A1C
HEMOGLOBIN A1C: 5.9 % — AB (ref 4.8–5.6)
MEAN PLASMA GLUCOSE: 123 mg/dL

## 2016-07-11 LAB — BASIC METABOLIC PANEL
ANION GAP: 9 (ref 5–15)
Anion gap: 7 (ref 5–15)
BUN: 23 mg/dL — ABNORMAL HIGH (ref 6–20)
BUN: 26 mg/dL — AB (ref 6–20)
CALCIUM: 8.9 mg/dL (ref 8.9–10.3)
CO2: 30 mmol/L (ref 22–32)
CO2: 28 mmol/L (ref 22–32)
CREATININE: 1.36 mg/dL — AB (ref 0.61–1.24)
Calcium: 8.8 mg/dL — ABNORMAL LOW (ref 8.9–10.3)
Chloride: 101 mmol/L (ref 101–111)
Chloride: 100 mmol/L — ABNORMAL LOW (ref 101–111)
Creatinine, Ser: 1.39 mg/dL — ABNORMAL HIGH (ref 0.61–1.24)
GFR calc Af Amer: 51 mL/min — ABNORMAL LOW (ref >60)
GFR, EST AFRICAN AMERICAN: 52 mL/min — AB (ref >60)
GFR, EST NON AFRICAN AMERICAN: 45 mL/min — AB (ref >60)
GFR, EST NON AFRICAN AMERICAN: 44 mL/min — AB (ref >60)
GLUCOSE: 206 mg/dL — AB (ref 65–99)
GLUCOSE: 129 mg/dL — AB (ref 65–99)
POTASSIUM: 3.8 mmol/L (ref 3.5–5.1)
Potassium: 4.2 mmol/L (ref 3.5–5.1)
Sodium: 138 mmol/L (ref 135–145)
Sodium: 137 mmol/L (ref 135–145)

## 2016-07-11 LAB — CBC
HEMATOCRIT: 40.1 % (ref 40.0–52.0)
Hemoglobin: 13 g/dL (ref 13.0–18.0)
MCH: 30.3 pg (ref 26.0–34.0)
MCHC: 32.5 g/dL (ref 32.0–36.0)
MCV: 93.1 fL (ref 80.0–100.0)
PLATELETS: 154 10*3/uL (ref 150–440)
RBC: 4.31 MIL/uL — AB (ref 4.40–5.90)
RDW: 16.1 % — ABNORMAL HIGH (ref 11.5–14.5)
WBC: 5.2 10*3/uL (ref 3.8–10.6)

## 2016-07-11 LAB — URINE CULTURE: Culture: NO GROWTH

## 2016-07-11 LAB — LIPID PANEL
CHOL/HDL RATIO: 3.3 ratio
Cholesterol: 160 mg/dL (ref 0–200)
HDL: 49 mg/dL (ref >40)
LDL CALC: 91 mg/dL (ref 0–99)
Triglycerides: 99 mg/dL (ref <150)
VLDL: 20 mg/dL (ref 0–40)

## 2016-07-11 MED ORDER — SODIUM CHLORIDE 0.9 % IV SOLN
INTRAVENOUS | Status: AC
  Administered 2016-07-11: 09:00:00 via INTRAVENOUS

## 2016-07-11 MED ORDER — LEVOFLOXACIN 750 MG PO TABS: 750.0000 mg | ORAL_TABLET | Freq: Every day | ORAL | 0 refills | Status: DC

## 2016-07-11 MED ORDER — PREDNISONE 10 MG (21) PO TBPK: 10.0000 mg | ORAL_TABLET | Freq: Every day | ORAL | 0 refills | Status: DC

## 2016-07-11 MED ORDER — CLOPIDOGREL BISULFATE 75 MG PO TABS: 75.0000 mg | ORAL_TABLET | Freq: Every day | ORAL | 5 refills | Status: DC

## 2016-07-11 MED ORDER — ATORVASTATIN CALCIUM 40 MG PO TABS: 40.0000 mg | ORAL_TABLET | Freq: Every day | ORAL | 5 refills | Status: DC

## 2016-07-11 MED ORDER — IPRATROPIUM-ALBUTEROL 0.5-2.5 (3) MG/3ML IN SOLN: 3.0000 mL | RESPIRATORY_TRACT | 5 refills | Status: AC

## 2016-07-11 NOTE — Discharge Summary (Addendum)
[facility] [place] [facility]   PATIENT NAME: Dakota [name]    MR#:  [id number]  DATE OF BIRTH:  [birth year]  DATE OF ADMISSION:  07/10/2016 ADMITTING PHYSICIAN: Enid Baas, MD  DATE OF DISCHARGE: No discharge date for patient encounter.  PRIMARY CARE PHYSICIAN: Elizabeth Sauer, MD     ADMISSION DIAGNOSIS:  Dyspnea [R06.00] Carotid stenosis [I65.29] Cerebral infarction (HCC) [I63.9] CVA (cerebral infarction) [I63.9]  DISCHARGE DIAGNOSIS:  Principal Problem:   CVA (cerebral vascular accident) (HCC) Active Problems:   Expressive aphasia   Receptive aphasia   Acute renal insufficiency   Carotid stenosis   Occlusion and stenosis of vertebral artery   COPD exacerbation (HCC)   Acute bronchitis   Pyuria   SECONDARY DIAGNOSIS:   Past Medical History:  Diagnosis Date  . Anemia   . Aortic stenosis   . CAD (coronary artery disease)    s/p CABG  . Gout   . H/O aortic valve replacement   . Hyperlipidemia   . Hypertension   . Rheumatoid arthritis (HCC)     .pro HOSPITAL COURSE:   The patient is 82 year old Caucasian male with medical history significant for history of aortic valve replacement, hypertension, hyperlipidemia, coronary artery disease, status post coronary artery bypass grafting, who presents to the hospital with complaints of expressive and receptive aphasia, confusion. On arrival to the hospital. Patient's CT scan revealed hypoattenuation in the left temporal lobe, concerning for stroke. Patient was admitted. He was seen by neurologist, who added Plavix to his medication regiment. MRI, MRA of his brain was recommended, which revealed left temporal Garris, sylvian fissure, left thalamic changes concerning for acute subacute stroke. MRA showed occluded left internal carotid artery, high-grade stenosis in the right vertebral artery. CT angiogram showed occluded Left ICA occlusion at the bifurcation. Faint reconstitution seen at the  cavernous ICA. 50-60% proximal right ICA narrowing. Non dominant right vertebral artery occlusion at the ostium with thready intermittent reconstitution. The vessel does not provide significant flow to the basilar. The dominant left vertebral artery is smooth and widely patent. Advanced atherosclerosis. Subpleural opacity in the right upper lobe could be atelectasis, infection, or neoplasm. After stabilization, recommend diagnostic chest CT. If there are infectious signs, would image after antibiotics. Patient was seen by vascular surgeon, who did not recommend any surgical intervention, but outpatient follow-up. Patient was seen by physical therapist, occupational therapist and speech therapist. Home health services will be arranged upon discharge Discussion by problem:   #1. Acute ischemic stroke with receptive and expressive aphasia, continue speech therapist intervention, home health services will be arranged upon discharge, continue aspirin and Plavix, Lipitor, lipid panel is pending. Hemoglobin A1c was 5.9. Echocardiogram was normal , but this was not a bubble study.  #2 . Relative hypotension, hold Lasix, Lopressor, until recommended by primary care physician #3. COPD exacerbation, continue steroid taper, DuoNeb's, ordered a nebulizing machine for home #4. Aspiration pneumonitis, continue antibiotic therapy, speech therapist evaluation is pending #5. Acute renal insufficiency, likely due to diuretics, no significant improvement with IV fluids, needs to have a checked as outpatientas the patient is s/p CTA , getting bladder scan as outpatient #6 pyuria, sterile, no UTI, urinary culture was negative  CONSULTS OBTAINED:  Treatment Team:  Kym Groom, MD Thana Farr, MD Bertram Denver, MD  DRUG ALLERGIES:   Allergies  Allergen Reactions  . Colchicine Shortness Of Breath  . Penicillin G Rash    DISCHARGE MEDICATIONS:   Current Discharge Medication List  START taking these  medications   Details  atorvastatin (LIPITOR) 40 MG tablet Take 1 tablet (40 mg total) by mouth daily at 6 PM. Qty: 30 tablet, Refills: 5    clopidogrel (PLAVIX) 75 MG tablet Take 1 tablet (75 mg total) by mouth daily. Qty: 30 tablet, Refills: 5    ipratropium-albuterol (DUONEB) 0.5-2.5 (3) MG/3ML SOLN Take 3 mLs by nebulization every 4 (four) hours. Qty: 360 mL, Refills: 5    levofloxacin (LEVAQUIN) 750 MG tablet Take 1 tablet (750 mg total) by mouth daily. Qty: 5 tablet, Refills: 0    predniSONE (STERAPRED UNI-PAK 21 TAB) 10 MG (21) TBPK tablet Take 1 tablet (10 mg total) by mouth daily. Please take 6 pills in the morning on the day one, then taper by 1 pill daily until finished, thank you Qty: 21 tablet, Refills: 0      CONTINUE these medications which have NOT CHANGED   Details  albuterol (PROVENTIL HFA;VENTOLIN HFA) 108 (90 Base) MCG/ACT inhaler Inhale 2 puffs into the lungs every 6 (six) hours as needed for wheezing or shortness of breath. Qty: 1 Inhaler, Refills: 0   Associated Diagnoses: Reactive airway disease, mild intermittent, uncomplicated    allopurinol (ZYLOPRIM) 300 MG tablet Take 1 tablet by mouth 2 (two) times daily. Dr Lavenia Atlas    aspirin EC 81 MG tablet Take 81 mg by mouth daily.    furosemide (LASIX) 20 MG tablet Take 1 tablet by mouth as needed. Dr Lady Gary    hydroxychloroquine (PLAQUENIL) 200 MG tablet Take 2 tablets by mouth daily. Dr Lavenia Atlas    metoprolol tartrate (LOPRESSOR) 25 MG tablet Take 1 tablet by mouth 2 (two) times daily. Dr Lady Gary    predniSONE (DELTASONE) 5 MG tablet Take 5 mg by mouth daily as needed.    triamcinolone cream (KENALOG) 0.1 % APPLY TO FACIAL RASH 2 TIMES DAILY FOR 1 WEEK, THEN DAILY Qty: 120 g, Refills: 1      STOP taking these medications     pravastatin (PRAVACHOL) 10 MG tablet          DISCHARGE INSTRUCTIONS:    Follow-up with primary care physician as outpatient  If you experience worsening of your  admission symptoms, develop shortness of breath, life threatening emergency, suicidal or homicidal thoughts you must seek medical attention immediately by calling 911 or calling your MD immediately  if symptoms less severe.  You Must read complete instructions/literature along with all the possible adverse reactions/side effects for all the Medicines you take and that have been prescribed to you. Take any new Medicines after you have completely understood and accept all the possible adverse reactions/side effects.   Please note  You were cared for by a hospitalist during your hospital stay. If you have any questions about your discharge medications or the care you received while you were in the hospital after you are discharged, you can call the unit and asked to speak with the hospitalist on call if the hospitalist that took care of you is not available. Once you are discharged, your primary care physician will handle any further medical issues. Please note that NO REFILLS for any discharge medications will be authorized once you are discharged, as it is imperative that you return to your primary care physician (or establish a relationship with a primary care physician if you do not have one) for your aftercare needs so that they can reassess your need for medications and monitor your lab values.    Today  CHIEF COMPLAINT:   Chief Complaint  Patient presents with  . Altered Mental Status    HISTORY OF PRESENT ILLNESS:  Dakota [name]  is a 82 y.o. male with a known history of aortic valve replacement, hypertension, hyperlipidemia, coronary artery disease, status post coronary artery bypass grafting, who presents to the hospital with complaints of expressive and receptive aphasia, confusion. On arrival to the hospital. Patient's CT scan revealed hypoattenuation in the left temporal lobe, concerning for stroke. Patient was admitted. He was seen by neurologist, who added Plavix to his medication  regiment. MRI, MRA of his brain was recommended, which revealed left temporal Garris, sylvian fissure, left thalamic changes concerning for acute subacute stroke. MRA showed occluded left internal carotid artery, high-grade stenosis in the right vertebral artery. CT angiogram showed occluded Left ICA occlusion at the bifurcation. Faint reconstitution seen at the cavernous ICA. 50-60% proximal right ICA narrowing. Non dominant right vertebral artery occlusion at the ostium with thready intermittent reconstitution. The vessel does not provide significant flow to the basilar. The dominant left vertebral artery is smooth and widely patent. Advanced atherosclerosis. Subpleural opacity in the right upper lobe could be atelectasis, infection, or neoplasm. After stabilization, recommend diagnostic chest CT. If there are infectious signs, would image after antibiotics. Patient was seen by vascular surgeon, who did not recommend any surgical intervention, but outpatient follow-up. Patient was seen by physical therapist, occupational therapist and speech therapist. Home health services will be arranged upon discharge Discussion by problem:   #1. Acute ischemic stroke with receptive and expressive aphasia, continue speech therapist intervention, home health services will be arranged upon discharge, continue aspirin and Plavix, Lipitor, lipid panel is pending. Hemoglobin A1c was 5.9. Echocardiogram was normal , but this was not a bubble study. #2 . Relative hypotension, hold Lasix, Lopressor, until recommended by primary care physician #3. COPD exacerbation, continue steroid taper, DuoNeb's, ordered a nebulizing machine for home #4. Aspiration pneumonitis, continue antibiotic therapy, speech therapist evaluation is pending #5. Acute renal insufficiency, likely due to diuretics, no significant improvement with IV fluids, needs to have a checked as outpatient as the patient is s/p CTA, getting bladder scan as outpatient #6  pyuria, sterile, no UTI, urinary culture was negative  VITAL SIGNS:  Blood pressure (!) 107/58, pulse 83, temperature 97.6 F (36.4 C), temperature source Oral, resp. rate 18, height 5\' 8"  (1.727 m), weight 85.5 kg (188 lb 9.6 oz), SpO2 99 %.  I/O:   Intake/Output Summary (Last 24 hours) at 07/11/16 1427 Last data filed at 07/11/16 1021  Gross per 24 hour  Intake                0 ml  Output              150 ml  Net             -150 ml    PHYSICAL EXAMINATION:  GENERAL:  82 y.o.-year-old patient lying in the bed with no acute distress.  EYES: Pupils equal, round, reactive to light and accommodation. No scleral icterus. Extraocular muscles intact.  HEENT: Head atraumatic, normocephalic. Oropharynx and nasopharynx clear.  NECK:  Supple, no jugular venous distention. No thyroid enlargement, no tenderness.  LUNGS: Normal breath sounds bilaterally, no wheezing, rales,rhonchi or crepitation. No use of accessory muscles of respiration.  CARDIOVASCULAR: S1, S2 normal. No murmurs, rubs, or gallops.  ABDOMEN: Soft, non-tender, non-distended. Bowel sounds present. No organomegaly or mass.  EXTREMITIES: No pedal edema, cyanosis, or clubbing.  NEUROLOGIC:  Cranial nerves II through XII are intact. Muscle strength 5/5 in all extremities. Sensation intact. Gait not checked.  PSYCHIATRIC: The patient is alert and oriented x 3.  SKIN: No obvious rash, lesion, or ulcer.   DATA REVIEW:   CBC  Recent Labs Lab 07/11/16 0435  WBC 5.2  HGB 13.0  HCT 40.1  PLT 154    Chemistries   Recent Labs Lab 07/11/16 1224  NA 137  K 3.8  CL 100*  CO2 28  GLUCOSE 129*  BUN 26*  CREATININE 1.39*  CALCIUM 8.8*    Cardiac Enzymes  Recent Labs Lab 07/10/16 1157  TROPONINI <0.03    Microbiology Results  No results found for this or any previous visit.  RADIOLOGY:  Dg Chest 1 View  Result Date: 07/10/2016 CLINICAL DATA:  Expressive aphasia, dyspnea EXAM: CHEST 1 VIEW COMPARISON:  06/12/2010  FINDINGS: Cardiac shadow is within normal limits. Postsurgical changes are noted consistent with bypass grafting and aortic valve replacement. The lungs are well aerated bilaterally. No focal infiltrate or sizable effusion is seen. No acute bony abnormality is noted. IMPRESSION: No active disease. Electronically Signed   By: Alcide CleverMark  Lukens M.D.   On: 07/10/2016 16:07   Ct Head Wo Contrast  Result Date: 07/10/2016 CLINICAL DATA:  Altered mental status for 4 days.  No known injury. EXAM: CT HEAD WITHOUT CONTRAST TECHNIQUE: Contiguous axial images were obtained from the base of the skull through the vertex without intravenous contrast. COMPARISON:  None. FINDINGS: Brain: Hypoattenuation is seen in the anterior aspect of the left temporal lobe with an appearance most suspicious for subacute infarct. 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 The brain is atrophic with chronic microvascular ischemic change. Remote lacunar infarction in the left thalamus is identified. No hemorrhage, midline shift or abnormal extra-axial fluid collection is seen. Vascular: Atherosclerosis noted.  No hyperdense vessel identified. Skull: Intact. Sinuses/Orbits: Small amount of secretions in the left sphenoid sinus noted. Other: None. IMPRESSION: Hypoattenuation in the anterior left temporal lobe most consistent with early subacute to subacute infarct. Atrophy and chronic microvascular ischemic change. Remote right cerebellar infarct noted. Atherosclerosis. Electronically Signed   By: Drusilla Kannerhomas  Dalessio M.D.   On: 07/10/2016 12:31   Ct Angio Neck W Or Wo Contrast  Result Date: 07/10/2016 CLINICAL DATA:  Carotid stenosis.  Acute infarct. EXAM: CT ANGIOGRAPHY NECK TECHNIQUE: Multidetector CT imaging of the neck was performed using the standard protocol during bolus administration of intravenous contrast. Multiplanar CT image reconstructions and MIPs were obtained to evaluate the vascular anatomy. Carotid stenosis measurements (when  applicable) are obtained utilizing NASCET criteria, using the distal internal carotid diameter as the denominator. CONTRAST:  75 cc Isovue 370 intravenous COMPARISON:  None. FINDINGS: Aortic arch: Extensive diffuse irregular atherosclerotic plaque. Two vessel branching Right carotid system: Diffuse predominately noncalcified atheromatous wall thickening. Bulky plaque on the common carotid and proximal ICA with proximal ICA narrowing measuring up to 50-60 percent (best measured on sagittal reformats). Maximal narrowing is in the proximal ICA below the level of the mandibular angle. No ulceration or dissection noted. Left carotid system: Diffuse noncalcified atheromatous wall thickening of the common carotid artery. Bulky plaque at the bifurcation and proximal ICA with occlusion just beyond the bifurcation. Vertebral arteries: Heavy noncalcified plaque deposition in the proximal left subclavian without flow limiting stenosis. The dominant left vertebral artery is widely patent to the dura.Non dominant right vertebral artery is occluded at its origin with thready intermittent reconstitution in the neck, faintly seen at the  vertebrobasilar junction. Faint flow seen in the right PICA Skeleton: Diffuse advanced disc degeneration with kyphotic deformity of the cervical spine. At least moderate spinal stenosis greatest at C3-4. Multilevel foraminal narrowing. Other neck: No incidental mass or adenopathy. Upper chest: Subpleural nodularity in the right upper lobe dominant nodule measuring 9 mm. Paraseptal emphysema. Status post CABG. IMPRESSION: 1. 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 Faint reconstitution seen at the cavernous ICA. 2. 50-60% proximal right ICA narrowing. 3. Non dominant right vertebral artery occlusion at the ostium with thready intermittent reconstitution. The vessel does not provide significant flow to the basilar. The dominant left vertebral artery is smooth and widely patent. 4. Advanced  atherosclerosis 5. Subpleural opacity in the right upper lobe could be atelectasis, infection, or neoplasm. After stabilization, recommend diagnostic chest CT. If there are infectious signs, would image after antibiotics. Electronically Signed   By: Marnee Spring M.D.   On: 07/10/2016 19:09   Mr Maxine Glenn Head Wo Contrast  Result Date: 07/10/2016 CLINICAL DATA:  Altered mental status for 4 days. Aphasia. No focal weakness. EXAM: MRI HEAD WITHOUT CONTRAST MRA HEAD WITHOUT CONTRAST TECHNIQUE: Multiplanar, multiecho pulse sequences of the brain and surrounding structures were obtained without intravenous contrast. Angiographic images of the head were obtained using MRA technique without contrast. COMPARISON:  CT head without contrast 07/10/2016. FINDINGS: MRI HEAD FINDINGS Brain: The diffusion-weighted images confirm an acute nonhemorrhagic infarct involving the left temporal lobe and posterior sylvian fissure. There is also a focal area of acute infarction within the anterior left thalamus. T2 changes are associated with the areas of acute/ subacute infarction. Moderate generalized atrophy and white matter changes are present otherwise. A remote inferior right cerebellar infarct is present. Remote lacunar infarcts are present in the left cerebellum. The study is mildly degraded by patient motion. Vascular: The left internal carotid artery is occluded. It is reconstituted at the left ICA terminus. There is flow in the right internal carotid artery and both vertebral arteries. Skull and upper cervical spine: The skullbase is within normal limits. Moderate narrowing is present at C2-3 and C3-4. Midline sagittal structures are unremarkable. Sinuses/Orbits: The paranasal sinuses and mastoid air cells are clear. A right lens replacement is noted. The globes and orbits are otherwise within normal limits. MRA HEAD FINDINGS A left internal carotid artery is occluded. Flow is present in the right internal carotid artery from  the high cervical segments through the ICA terminus. There is flow in the A1 segments bilaterally. The anterior communicating artery is patent. ACA branch vessels are within normal limits. There is flow in the left M1 segment. Signal is less robust than on the right. There is some irregularity in the right M1 segment. The MCA bifurcations are intact. There is attenuation of MCA branch vessels, left greater than right. The left vertebral artery is the dominant vessel. There is signal loss in the distal right vertebral artery suggesting high-grade stenosis or occlusion. The basilar artery is normal. Both posterior cerebral arteries originate from the basilar tip. There is asymmetric attenuation of distal right PCA branch vessels. IMPRESSION: 1. Occluded left internal carotid artery. 2. Although there is flow across the anterior communicating artery, flow signal is decreased in the left M1 segment compared to the right and there is advanced attenuation of left MCA branch vessels compared to the right. 3. Moderate diffuse small vessel disease. 4. High-grade stenosis or occlusion of distal right vertebral artery. 5. Acute/subacute nonhemorrhagic infarct involving the left superior temporal gyrus and sylvian fissure 6.  Acute/subacute non hemorrhagic infarct within the medial anterior left thalamus. 7. Moderate diffuse atrophy and white matter disease otherwise. 8. Remote cerebellar infarcts as described. Electronically Signed   By: Marin Roberts M.D.   On: 07/10/2016 16:15   Mr Brain Wo Contrast  Result Date: 07/10/2016 CLINICAL DATA:  Altered mental status for 4 days. Aphasia. No focal weakness. EXAM: MRI HEAD WITHOUT CONTRAST MRA HEAD WITHOUT CONTRAST TECHNIQUE: Multiplanar, multiecho pulse sequences of the brain and surrounding structures were obtained without intravenous contrast. Angiographic images of the head were obtained using MRA technique without contrast. COMPARISON:  CT head without contrast  07/10/2016. FINDINGS: MRI HEAD FINDINGS Brain: The diffusion-weighted images confirm an acute nonhemorrhagic infarct involving the left temporal lobe and posterior sylvian fissure. There is also a focal area of acute infarction within the anterior left thalamus. T2 changes are associated with the areas of acute/ subacute infarction. Moderate generalized atrophy and white matter changes are present otherwise. A remote inferior right cerebellar infarct is present. Remote lacunar infarcts are present in the left cerebellum. The study is mildly degraded by patient motion. Vascular: The left internal carotid artery is occluded. It is reconstituted at the left ICA terminus. There is flow in the right internal carotid artery and both vertebral arteries. Skull and upper cervical spine: The skullbase is within normal limits. Moderate narrowing is present at C2-3 and C3-4. Midline sagittal structures are unremarkable. Sinuses/Orbits: The paranasal sinuses and mastoid air cells are clear. A right lens replacement is noted. The globes and orbits are otherwise within normal limits. MRA HEAD FINDINGS A left internal carotid artery is occluded. Flow is present in the right internal carotid artery from the high cervical segments through the ICA terminus. There is flow in the A1 segments bilaterally. The anterior communicating artery is patent. ACA branch vessels are within normal limits. There is flow in the left M1 segment. Signal is less robust than on the right. There is some irregularity in the right M1 segment. The MCA bifurcations are intact. There is attenuation of MCA branch vessels, left greater than right. The left vertebral artery is the dominant vessel. There is signal loss in the distal right vertebral artery suggesting high-grade stenosis or occlusion. The basilar artery is normal. Both posterior cerebral arteries originate from the basilar tip. There is asymmetric attenuation of distal right PCA branch vessels.  IMPRESSION: 1. Occluded left internal carotid artery. 2. Although there is flow across the anterior communicating artery, flow signal is decreased in the left M1 segment compared to the right and there is advanced attenuation of left MCA branch vessels compared to the right. 3. Moderate diffuse small vessel disease. 4. High-grade stenosis or occlusion of distal right vertebral artery. 5. Acute/subacute nonhemorrhagic infarct involving the left superior temporal gyrus and sylvian fissure 6. Acute/subacute non hemorrhagic infarct within the medial anterior left thalamus. 7. Moderate diffuse atrophy and white matter disease otherwise. 8. Remote cerebellar infarcts as described. Electronically Signed   By: Marin Roberts M.D.   On: 07/10/2016 16:15   US Carotid Bilateral  Result Date: 07/10/2016 CLINICAL DATA:  CVA and altered speech. EXAM: BILATERAL CAROTID DUPLEX ULTRASOUND TECHNIQUE: Wallace Cullens scale imaging, color Doppler and duplex ultrasound were performed of bilateral carotid and vertebral arteries in the neck. COMPARISON:  Head CT 07/10/2016 FINDINGS: Criteria: Quantification of carotid stenosis is based on velocity parameters that correlate the residual internal carotid diameter with NASCET-based stenosis levels, using the diameter of the distal internal carotid lumen as the denominator for stenosis  measurement. The following velocity measurements were obtained: RIGHT ICA:  104 cm/sec CCA:  88 cm/sec SYSTOLIC ICA/CCA RATIO:  1.3 DIASTOLIC ICA/CCA RATIO:  1.2 ECA:  138 cm/sec LEFT ICA:  Occluded CCA:  60 cm/sec ECA:  94 cm/sec RIGHT CAROTID ARTERY: Scattered echogenic plaque throughout the right common carotid artery. Calcified plaque at the right carotid bulb. Plaque in the external carotid artery. External carotid artery is patent with normal waveform. Scattered plaque throughout the proximal internal carotid artery. Normal waveforms and velocities in the internal carotid artery. RIGHT VERTEBRAL ARTERY:  Antegrade flow and normal waveform in the right vertebral artery. LEFT CAROTID ARTERY: Plaque in the left common carotid artery. Left common carotid artery is patent. Left carotid bulb is patent. Echogenic plaque involving the left external carotid artery. The origin of the left internal carotid artery is patent but there is a large amount of plaque just beyond the origin. Mid and distal left internal carotid artery are occluded. LEFT VERTEBRAL ARTERY: Antegrade flow and normal waveform in the left vertebral artery. IMPRESSION: Left internal carotid artery is occluded just beyond its origin. Chronicity of this finding is unknown. Atherosclerotic disease in bilateral carotid arteries. Estimated degree of stenosis in the right internal carotid artery is less than 50%. Patent vertebral arteries. These results will be called to the ordering clinician or representative by the Radiologist Assistant, and communication documented in the PACS or zVision Dashboard. Electronically Signed   By: Richarda Overlie M.D.   On: 07/10/2016 15:46    EKG:   Orders placed or performed during the hospital encounter of 07/10/16  . EKG 12-Lead  . EKG 12-Lead  . ED EKG  . ED EKG      Management plans discussed with the patient, family and they are in agreement.  CODE STATUS:     Code Status Orders        Start     Ordered   07/10/16 1635  Full code  Continuous     07/10/16 1634    Code Status History    Date Active Date Inactive Code Status Order ID Comments User Context   This patient has a current code status but no historical code status.    Advance Directive Documentation   Flowsheet Row Most Recent Value  Type of Advance Directive  Healthcare Power of Attorney  Pre-existing out of facility DNR order (yellow form or pink MOST form)  No data  "MOST" Form in Place?  No data      TOTAL TIME TAKING CARE OF THIS PATIENT: 40 minutes.    Katharina Caper M.D on 07/11/2016 at 2:27 PM  Between 7am to 6pm - Pager  - [phone]  After 6pm go to [email or web address] - password [email or web address] [facility]  [place]  [facility]  Office  [phone]  CC: Primary care physician; Elizabeth Sauer, MD

## 2016-07-11 NOTE — Evaluation (Signed)
Occupational Therapy Evaluation Patient Details Name: Dakota [name] MRN: [id number] DOB: March 31, 1926 Today's Date: 07/11/2016    History of Present Illness Pt. is an 82 y.o. male who was admitted to [facility] with onset of aphasia.   Clinical Impression   Pt. Is an 82 y.o. Male who was admitted to [facility] with an onset of aphasia. Pt. Is back to his baseline with UE functioning, and basic ADL tasks. No further OT services are warranted at this time.    Follow Up Recommendations  No OT follow up    Equipment Recommendations       Recommendations for Other Services       Precautions / Restrictions Precautions Precautions: Fall Restrictions Weight Bearing Restrictions: No         Balance Overall balance assessment: No apparent balance deficits (not formally assessed);Needs assistance;History of Falls                                          ADL Overall ADL's : Needs assistance/impaired Eating/Feeding: Set up   Grooming: Set up           Upper Body Dressing : Set up   Lower Body Dressing: Set up, MinA                 General ADL Comments: Pt. is able to use in bilateral UEs for basic self-care tasks.     Vision     Perception     Praxis      Pertinent Vitals/Pain Pain Assessment: No/denies pain Pain Score: 0-No pain     Hand Dominance Right   Extremity/Trunk Assessment Upper Extremity Assessment Upper Extremity Assessment: Overall WFL for tasks assessed           Communication     Cognition Arousal/Alertness: Awake/alert Behavior During Therapy: WFL for tasks assessed/performed Overall Cognitive Status: Difficult to assess                 General Comments: Caregiver reports soem intermitten tconfuson and hallucination at baseline.    General Comments       Exercises       Shoulder Instructions      Home Living Family/patient expects to be discharged to:: Private residence Living Arrangements:  Spouse/significant other Available Help at Discharge: Family Type of Home: Mobile home Home Access: Stairs to enter Entrance Stairs-Number of Steps: 4   Home Layout: One level     Bathroom Shower/Tub: Producer, television/film/video: Standard     Home Equipment: Environmental consultant - 2 wheels;Cane - single point          Prior Functioning/Environment Level of Independence: Needs assistance  Gait / Transfers Assistance Needed:  ADL's / Homemaking Assistance Needed: some assistance with getting pants donned  Communication / Swallowing Assistance Needed: very HOH          OT Problem List:     OT Treatment/Interventions:      OT Goals(Current goals can be found in the care plan section) Acute Rehab OT Goals Patient Stated Goal: To return home OT Goal Formulation: With patient Potential to Achieve Goals: Good  OT Frequency:     Barriers to D/C:            Co-evaluation              End of Session    Activity Tolerance: Patient tolerated  treatment well Patient left: in bed;with call bell/phone within reach;with bed alarm set   Time: 2800-3491 OT Time Calculation (min): 21 min Charges:  OT General Charges $OT Visit: 1 Procedure OT Evaluation $OT Eval Moderate Complexity: 1 Procedure G-Codes:    Olegario Messier, MS, OTR/L 07/11/2016, 11:39 AM

## 2016-07-11 NOTE — Evaluation (Signed)
Physical Therapy Evaluation Patient Details Name: Dakota [name] MRN: [id number] DOB: 1926/03/19 Today's Date: 07/11/2016   History of Present Illness  Dakota [name] is an 82yo white male who comes to [facility] after 3 days of acute onset verbal expressive communication difficulty. His caregiver reports that he had intermittent periods wherein his language was completely nonsensical, described as "almost speaking in another language." Pt admitted for CVA, PT now asked to evaluate. At baseline, pt AMB limited community distances with AD (refuses compliance) with limitatiosn from back pain, and has some trouble with stairs. His caregiver reports that he seems to have mild hallucinations from time to time over the last few months.   Clinical Impression  Pt performing all mobility at baseline, including bed mobility, transfers, gait, and stairs, all at supervision level, with good safety awareness. Pt demonstrates no LOB, however details balance screening is not performed at this time due to [facility], pt has had 2 falls in the past 6 months per caregiver. No expressive language deficits noted at this time that are different from baseline per caregiver. No additional PT services needed. All additional services can be met at next venue of care to address baseline weakness, balance impairments, and mobility deficits. PT signing off.     Follow Up Recommendations Home health PT;Supervision for mobility/OOB;Supervision - Intermittent    Equipment Recommendations  None recommended by PT    Recommendations for Other Services       Precautions / Restrictions Precautions Precautions: Fall Restrictions Weight Bearing Restrictions: No      Mobility  Bed Mobility Overal bed mobility: Modified Independent                Transfers Overall transfer level: Needs assistance Equipment used: Rolling walker (2 wheeled) Transfers: Sit to/from Stand Sit to Stand: Supervision            Ambulation/Gait    Ambulation Distance (Feet): 500 Feet Assistive device: Rolling walker (2 wheeled);None Gait Pattern/deviations: WFL(Within Functional Limits) Gait velocity: .090ms Gait velocity interpretation: <1.8 ft/sec, indicative of risk for recurrent falls General Gait Details: first 18 c RW, then no AD thereafter, perforoming head turns ad lib c mild lateral drifting. Good heel strike bilat.   Stairs Stairs: Yes   Stair Management: One rail Right Number of Stairs: 2 General stair comments: general anxiety about performing stairs, reports he is tired after gait trial.   Wheelchair Mobility    Modified Rankin (Stroke Patients Only)       Balance Overall balance assessment: No apparent balance deficits (not formally assessed);Needs assistance;History of Falls                                           Pertinent Vitals/Pain Pain Assessment: No/denies pain    Home Living Family/patient expects to be discharged to:: Private residence Living Arrangements: Other (Comment) (caregiver for 19 years; she does not further clarify relationship) Available Help at Discharge: Family Type of Home: Mobile home Home Access: Stairs to enter   Entrance Stairs-Number of Steps: 4 Home Layout: One level Home Equipment: Walker - 2 wheels;Cane - single point      Prior Function Level of Independence: Needs assistance   Gait / Transfers Assistance Needed: Limited communitydistances without AD  ADL's / Homemaking Assistance Needed: some assistance with getting pants donned         Hand Dominance  Extremity/Trunk Assessment                         Communication      Cognition Arousal/Alertness: Awake/alert Behavior During Therapy: WFL for tasks assessed/performed Overall Cognitive Status: Difficult to assess                 General Comments: Caregiver reports soem intermitten tconfuson and hallucination at baseline.     General Comments       Exercises     Assessment/Plan    PT Assessment All further PT needs can be met in the next venue of care  PT Problem List Decreased strength;Decreased activity tolerance;Decreased balance;Decreased mobility          PT Treatment Interventions      PT Goals (Current goals can be found in the Care Plan section)  Acute Rehab PT Goals PT Goal Formulation: All assessment and education complete, DC therapy    Frequency     Barriers to discharge        Co-evaluation               End of Session Equipment Utilized During Treatment: Gait belt Activity Tolerance: Patient tolerated treatment well;Patient limited by fatigue Patient left: in bed;with call bell/phone within reach;with bed alarm set;with family/visitor present Nurse Communication: Other (comment)         Time: 0413-6438 PT Time Calculation (min) (ACUTE ONLY): 23 min   Charges:   PT Evaluation $PT Eval Low Complexity: 1 Procedure     PT G Codes:        10:38 AM, Jul 27, 2016 Etta Grandchild, PT, DPT Physical Therapist - [place] [phone] (mobile)

## 2016-07-11 NOTE — Consult Note (Signed)
Reason for Consult:Left Carotid Occlusion/ CVA Referring Physician: Dr. [name] is an 82 y.o. male.  HPI: Patient with [name] known history of CAD status post bypass graft surgery, aortic valve replacement with bioprosthetic valve and hypertension that presented with 2-3 days of progressive weakness and aphasia per family.  The patient has baseline unsteadiness. There was no focal weakness or visual changes. CT of the head showed subacute infarct in the left temporal region and Left Carotid occlusion of unknown chronicity. Upon discussion with the family today, they feel his speech and overall symptoms have improved.   Past Medical History:  Diagnosis Date  . Anemia   . Aortic stenosis   . CAD (coronary artery disease)    s/p CABG  . Gout   . H/O aortic valve replacement   . Hyperlipidemia   . Hypertension   . Rheumatoid arthritis [facility])     Past Surgical History:  Procedure Laterality Date  . AORTIC VALVE REPLACEMENT (AVR)/CORONARY ARTERY BYPASS GRAFTING (CABG)    . CARDIAC VALVE REPLACEMENT    . CORONARY ARTERY BYPASS GRAFT    . EYE SURGERY     cataract  . forearm tendon surgery Right   . HERNIA REPAIR    . TONSILLECTOMY      Family History  Problem Relation Age of Onset  . Congestive Heart Failure Father     Social History:  reports that he has quit smoking. He has never used smokeless tobacco. He reports that he does not drink alcohol or use drugs.  Allergies:  Allergies  Allergen Reactions  . Colchicine Shortness Of Breath  . Penicillin G Rash    Medications: I have reviewed the patient's current medications.  Results for orders placed or performed during the hospital encounter of 07/10/16 (from the past 48 hour(s))  Ethanol     Status: None   Collection Time: 07/10/16 11:57 AM  Result Value Ref Range   Alcohol, Ethyl (B) <5 <5 mg/dL    Comment:        LOWEST DETECTABLE LIMIT FOR SERUM ALCOHOL IS 5 mg/dL FOR MEDICAL PURPOSES ONLY   Protime-INR      Status: None   Collection Time: 07/10/16 11:57 AM  Result Value Ref Range   Prothrombin Time 13.0 11.4 - 15.2 seconds   INR 0.98   APTT     Status: None   Collection Time: 07/10/16 11:57 AM  Result Value Ref Range   aPTT 30 24 - 36 seconds  CBC WITH DIFFERENTIAL     Status: Abnormal   Collection Time: 07/10/16 11:57 AM  Result Value Ref Range   WBC 8.6 3.8 - 10.6 K/uL   RBC 4.10 (L) 4.40 - 5.90 MIL/uL   Hemoglobin 12.5 (L) 13.0 - 18.0 g/dL   HCT 38.7 (L) 40.0 - 52.0 %   MCV 94.3 80.0 - 100.0 fL   MCH 30.5 26.0 - 34.0 pg   MCHC 32.3 32.0 - 36.0 g/dL   RDW 16.6 (H) 11.5 - 14.5 %   Platelets 153 150 - 440 K/uL   Neutrophils Relative % 68 %   Neutro Abs 5.9 1.4 - 6.5 K/uL   Lymphocytes Relative 15 %   Lymphs Abs 1.3 1.0 - 3.6 K/uL   Monocytes Relative 11 %   Monocytes Absolute 1.0 0.2 - 1.0 K/uL   Eosinophils Relative 5 %   Eosinophils Absolute 0.4 0 - 0.7 K/uL   Basophils Relative 1 %   Basophils Absolute 0.1 0 - 0.1  K/uL  Troponin I     Status: None   Collection Time: 07/10/16 11:57 AM  Result Value Ref Range   Troponin I <0.03 <0.03 ng/mL  Urinalysis, Complete w Microscopic     Status: Abnormal   Collection Time: 07/10/16 11:59 AM  Result Value Ref Range   Color, Urine YELLOW ([name]) YELLOW   APPearance HAZY ([name]) CLEAR   Specific Gravity, Urine 1.018 1.005 - 1.030   pH 6.0 5.0 - 8.0   Glucose, UA NEGATIVE NEGATIVE mg/dL   Hgb urine dipstick NEGATIVE NEGATIVE   Bilirubin Urine NEGATIVE NEGATIVE   Ketones, ur NEGATIVE NEGATIVE mg/dL   Protein, ur 100 ([name]) NEGATIVE mg/dL   Nitrite NEGATIVE NEGATIVE   Leukocytes, UA LARGE ([name]) NEGATIVE   RBC / HPF 6-30 0 - 5 RBC/hpf   WBC, UA TOO NUMEROUS TO COUNT 0 - 5 WBC/hpf   Bacteria, UA RARE ([name]) NONE SEEN   Squamous Epithelial / LPF NONE SEEN NONE SEEN   Mucous PRESENT   Basic metabolic panel     Status: Abnormal   Collection Time: 07/10/16  4:37 PM  Result Value Ref Range   Sodium 138 135 - 145 mmol/L   Potassium 4.9 3.5 - 5.1  mmol/L    Comment: HEMOLYSIS AT THIS LEVEL MAY AFFECT RESULT   Chloride 105 101 - 111 mmol/L   CO2 28 22 - 32 mmol/L   Glucose, Bld 92 65 - 99 mg/dL   BUN 15 6 - 20 mg/dL   Creatinine, Ser 1.06 0.61 - 1.24 mg/dL   Calcium 8.7 (L) 8.9 - 10.3 mg/dL   GFR calc non Af Amer >60 >60 mL/min   GFR calc Af Amer >60 >60 mL/min    Comment: (NOTE) The eGFR has been calculated using the CKD EPI equation. This calculation has not been validated in all clinical situations. eGFR's persistently <60 mL/min signify possible Chronic Kidney Disease.    Anion gap 5 5 - 15  Hemoglobin A1c     Status: Abnormal   Collection Time: 07/10/16  4:37 PM  Result Value Ref Range   Hgb A1c MFr Bld 5.9 (H) 4.8 - 5.6 %    Comment: (NOTE)         Pre-diabetes: 5.7 - 6.4         Diabetes: >6.4         Glycemic control for adults with diabetes: <7.0    Mean Plasma Glucose 123 mg/dL    Comment: (NOTE) Performed At: [facility] [place], [place] [id number] Lindon Romp MD WU:[id number]   TSH     Status: None   Collection Time: 07/10/16  4:37 PM  Result Value Ref Range   TSH 1.961 0.350 - 4.500 uIU/mL    Comment: Performed by [name] 3rd Generation assay with [name] functional sensitivity of <=0.01 uIU/mL.  Basic metabolic panel     Status: Abnormal   Collection Time: 07/11/16  4:35 AM  Result Value Ref Range   Sodium 138 135 - 145 mmol/L   Potassium 4.2 3.5 - 5.1 mmol/L   Chloride 101 101 - 111 mmol/L   CO2 30 22 - 32 mmol/L   Glucose, Bld 206 (H) 65 - 99 mg/dL   BUN 23 (H) 6 - 20 mg/dL   Creatinine, Ser 1.36 (H) 0.61 - 1.24 mg/dL   Calcium 8.9 8.9 - 10.3 mg/dL   GFR calc non Af Amer 45 (L) >60 mL/min   GFR calc Af Amer 52 (L) >  60 mL/min    Comment: (NOTE) The eGFR has been calculated using the CKD EPI equation. This calculation has not been validated in all clinical situations. eGFR's persistently <60 mL/min signify possible Chronic Kidney Disease.    Anion gap 7 5 - 15  CBC      Status: Abnormal   Collection Time: 07/11/16  4:35 AM  Result Value Ref Range   WBC 5.2 3.8 - 10.6 K/uL   RBC 4.31 (L) 4.40 - 5.90 MIL/uL   Hemoglobin 13.0 13.0 - 18.0 g/dL   HCT 40.1 40.0 - 52.0 %   MCV 93.1 80.0 - 100.0 fL   MCH 30.3 26.0 - 34.0 pg   MCHC 32.5 32.0 - 36.0 g/dL   RDW 16.1 (H) 11.5 - 14.5 %   Platelets 154 150 - 440 K/uL    Dg Chest 1 View  Result Date: 07/10/2016 CLINICAL DATA:  Expressive aphasia, dyspnea EXAM: CHEST 1 VIEW COMPARISON:  06/12/2010 FINDINGS: Cardiac shadow is within normal limits. Postsurgical changes are noted consistent with bypass grafting and aortic valve replacement. The lungs are well aerated bilaterally. No focal infiltrate or sizable effusion is seen. No acute bony abnormality is noted. IMPRESSION: No active disease. Electronically Signed   By: Inez Catalina M.D.   On: 07/10/2016 16:07   Ct Head Wo Contrast  Result Date: 07/10/2016 CLINICAL DATA:  Altered mental status for 4 days.  No known injury. EXAM: CT HEAD WITHOUT CONTRAST TECHNIQUE: Contiguous axial images were obtained from the base of the skull through the vertex without intravenous contrast. COMPARISON:  None. FINDINGS: Brain: Hypoattenuation is seen in the anterior aspect of the left temporal lobe with an appearance most suspicious for subacute infarct. 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 The brain is atrophic with chronic microvascular ischemic change. Remote lacunar infarction in the left thalamus is identified. No hemorrhage, midline shift or abnormal extra-axial fluid collection is seen. Vascular: Atherosclerosis noted.  No hyperdense vessel identified. Skull: Intact. Sinuses/Orbits: Small amount of secretions in the left sphenoid sinus noted. Other: None. IMPRESSION: Hypoattenuation in the anterior left temporal lobe most consistent with early subacute to subacute infarct. Atrophy and chronic microvascular ischemic change. Remote right cerebellar infarct noted. Atherosclerosis.  Electronically Signed   By: Inge Rise M.D.   On: 07/10/2016 12:31   Ct Angio Neck W Or Wo Contrast  Result Date: 07/10/2016 CLINICAL DATA:  Carotid stenosis.  Acute infarct. EXAM: CT ANGIOGRAPHY NECK TECHNIQUE: Multidetector CT imaging of the neck was performed using the standard protocol during bolus administration of intravenous contrast. Multiplanar CT image reconstructions and MIPs were obtained to evaluate the vascular anatomy. Carotid stenosis measurements (when applicable) are obtained utilizing NASCET criteria, using the distal internal carotid diameter as the denominator. CONTRAST:  75 cc Isovue 370 intravenous COMPARISON:  None. FINDINGS: Aortic arch: Extensive diffuse irregular atherosclerotic plaque. Two vessel branching Right carotid system: Diffuse predominately noncalcified atheromatous wall thickening. Bulky plaque on the common carotid and proximal ICA with proximal ICA narrowing measuring up to 50-60 percent (best measured on sagittal reformats). Maximal narrowing is in the proximal ICA below the level of the mandibular angle. No ulceration or dissection noted. Left carotid system: Diffuse noncalcified atheromatous wall thickening of the common carotid artery. Bulky plaque at the bifurcation and proximal ICA with occlusion just beyond the bifurcation. Vertebral arteries: Heavy noncalcified plaque deposition in the proximal left subclavian without flow limiting stenosis. The dominant left vertebral artery is widely patent to the dura.Non dominant right vertebral artery is  occluded at its origin with thready intermittent reconstitution in the neck, faintly seen at the vertebrobasilar junction. Faint flow seen in the right PICA Skeleton: Diffuse advanced disc degeneration with kyphotic deformity of the cervical spine. At least moderate spinal stenosis greatest at C3-4. Multilevel foraminal narrowing. Other neck: No incidental mass or adenopathy. Upper chest: Subpleural nodularity in the  right upper lobe dominant nodule measuring 9 mm. Paraseptal emphysema. Status post CABG. IMPRESSION: 1. 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 Faint reconstitution seen at the cavernous ICA. 2. 50-60% proximal right ICA narrowing. 3. Non dominant right vertebral artery occlusion at the ostium with thready intermittent reconstitution. The vessel does not provide significant flow to the basilar. The dominant left vertebral artery is smooth and widely patent. 4. Advanced atherosclerosis 5. Subpleural opacity in the right upper lobe could be atelectasis, infection, or neoplasm. After stabilization, recommend diagnostic chest CT. If there are infectious signs, would image after antibiotics. Electronically Signed   By: Monte Fantasia M.D.   On: 07/10/2016 19:09   Mr Jodene Nam Head Wo Contrast  Result Date: 07/10/2016 CLINICAL DATA:  Altered mental status for 4 days. Aphasia. No focal weakness. EXAM: MRI HEAD WITHOUT CONTRAST MRA HEAD WITHOUT CONTRAST TECHNIQUE: Multiplanar, multiecho pulse sequences of the brain and surrounding structures were obtained without intravenous contrast. Angiographic images of the head were obtained using MRA technique without contrast. COMPARISON:  CT head without contrast 07/10/2016. FINDINGS: MRI HEAD FINDINGS Brain: The diffusion-weighted images confirm an acute nonhemorrhagic infarct involving the left temporal lobe and posterior sylvian fissure. There is also [name] focal area of acute infarction within the anterior left thalamus. T2 changes are associated with the areas of acute/ subacute infarction. Moderate generalized atrophy and white matter changes are present otherwise. [name] remote inferior right cerebellar infarct is present. Remote lacunar infarcts are present in the left cerebellum. The study is mildly degraded by patient motion. Vascular: The left internal carotid artery is occluded. It is reconstituted at the left ICA terminus. There is flow in the right internal carotid artery and  both vertebral arteries. Skull and upper cervical spine: The skullbase is within normal limits. Moderate narrowing is present at C2-3 and C3-4. Midline sagittal structures are unremarkable. Sinuses/Orbits: The paranasal sinuses and mastoid air cells are clear. [name] right lens replacement is noted. The globes and orbits are otherwise within normal limits. MRA HEAD FINDINGS [name] left internal carotid artery is occluded. Flow is present in the right internal carotid artery from the high cervical segments through the ICA terminus. There is flow in the A1 segments bilaterally. The anterior communicating artery is patent. ACA branch vessels are within normal limits. There is flow in the left M1 segment. Signal is less robust than on the right. There is some irregularity in the right M1 segment. The MCA bifurcations are intact. There is attenuation of MCA branch vessels, left greater than right. The left vertebral artery is the dominant vessel. There is signal loss in the distal right vertebral artery suggesting high-grade stenosis or occlusion. The basilar artery is normal. Both posterior cerebral arteries originate from the basilar tip. There is asymmetric attenuation of distal right PCA branch vessels. IMPRESSION: 1. Occluded left internal carotid artery. 2. Although there is flow across the anterior communicating artery, flow signal is decreased in the left M1 segment compared to the right and there is advanced attenuation of left MCA branch vessels compared to the right. 3. Moderate diffuse small vessel disease. 4. High-grade stenosis or occlusion of distal right vertebral  artery. 5. Acute/subacute nonhemorrhagic infarct involving the left superior temporal gyrus and sylvian fissure 6. Acute/subacute non hemorrhagic infarct within the medial anterior left thalamus. 7. Moderate diffuse atrophy and white matter disease otherwise. 8. Remote cerebellar infarcts as described. Electronically Signed   By: San Morelle M.D.    On: 07/10/2016 16:15   Mr Brain Wo Contrast  Result Date: 07/10/2016 CLINICAL DATA:  Altered mental status for 4 days. Aphasia. No focal weakness. EXAM: MRI HEAD WITHOUT CONTRAST MRA HEAD WITHOUT CONTRAST TECHNIQUE: Multiplanar, multiecho pulse sequences of the brain and surrounding structures were obtained without intravenous contrast. Angiographic images of the head were obtained using MRA technique without contrast. COMPARISON:  CT head without contrast 07/10/2016. FINDINGS: MRI HEAD FINDINGS Brain: The diffusion-weighted images confirm an acute nonhemorrhagic infarct involving the left temporal lobe and posterior sylvian fissure. There is also [name] focal area of acute infarction within the anterior left thalamus. T2 changes are associated with the areas of acute/ subacute infarction. Moderate generalized atrophy and white matter changes are present otherwise. [name] remote inferior right cerebellar infarct is present. Remote lacunar infarcts are present in the left cerebellum. The study is mildly degraded by patient motion. Vascular: The left internal carotid artery is occluded. It is reconstituted at the left ICA terminus. There is flow in the right internal carotid artery and both vertebral arteries. Skull and upper cervical spine: The skullbase is within normal limits. Moderate narrowing is present at C2-3 and C3-4. Midline sagittal structures are unremarkable. Sinuses/Orbits: The paranasal sinuses and mastoid air cells are clear. [name] right lens replacement is noted. The globes and orbits are otherwise within normal limits. MRA HEAD FINDINGS [name] left internal carotid artery is occluded. Flow is present in the right internal carotid artery from the high cervical segments through the ICA terminus. There is flow in the A1 segments bilaterally. The anterior communicating artery is patent. ACA branch vessels are within normal limits. There is flow in the left M1 segment. Signal is less robust than on the right. There is  some irregularity in the right M1 segment. The MCA bifurcations are intact. There is attenuation of MCA branch vessels, left greater than right. The left vertebral artery is the dominant vessel. There is signal loss in the distal right vertebral artery suggesting high-grade stenosis or occlusion. The basilar artery is normal. Both posterior cerebral arteries originate from the basilar tip. There is asymmetric attenuation of distal right PCA branch vessels. IMPRESSION: 1. Occluded left internal carotid artery. 2. Although there is flow across the anterior communicating artery, flow signal is decreased in the left M1 segment compared to the right and there is advanced attenuation of left MCA branch vessels compared to the right. 3. Moderate diffuse small vessel disease. 4. High-grade stenosis or occlusion of distal right vertebral artery. 5. Acute/subacute nonhemorrhagic infarct involving the left superior temporal gyrus and sylvian fissure 6. Acute/subacute non hemorrhagic infarct within the medial anterior left thalamus. 7. Moderate diffuse atrophy and white matter disease otherwise. 8. Remote cerebellar infarcts as described. Electronically Signed   By: San Morelle M.D.   On: 07/10/2016 16:15   US Carotid Bilateral  Result Date: 07/10/2016 CLINICAL DATA:  CVA and altered speech. EXAM: BILATERAL CAROTID DUPLEX ULTRASOUND TECHNIQUE: Pearline Cables scale imaging, color Doppler and duplex ultrasound were performed of bilateral carotid and vertebral arteries in the neck. COMPARISON:  Head CT 07/10/2016 FINDINGS: Criteria: Quantification of carotid stenosis is based on velocity parameters that correlate the residual internal carotid diameter with NASCET-based stenosis  levels, using the diameter of the distal internal carotid lumen as the denominator for stenosis measurement. The following velocity measurements were obtained: RIGHT ICA:  104 cm/sec CCA:  88 cm/sec SYSTOLIC ICA/CCA RATIO:  1.3 DIASTOLIC ICA/CCA RATIO:   1.2 ECA:  138 cm/sec LEFT ICA:  Occluded CCA:  60 cm/sec ECA:  94 cm/sec RIGHT CAROTID ARTERY: Scattered echogenic plaque throughout the right common carotid artery. Calcified plaque at the right carotid bulb. Plaque in the external carotid artery. External carotid artery is patent with normal waveform. Scattered plaque throughout the proximal internal carotid artery. Normal waveforms and velocities in the internal carotid artery. RIGHT VERTEBRAL ARTERY: Antegrade flow and normal waveform in the right vertebral artery. LEFT CAROTID ARTERY: Plaque in the left common carotid artery. Left common carotid artery is patent. Left carotid bulb is patent. Echogenic plaque involving the left external carotid artery. The origin of the left internal carotid artery is patent but there is [name] large amount of plaque just beyond the origin. Mid and distal left internal carotid artery are occluded. LEFT VERTEBRAL ARTERY: Antegrade flow and normal waveform in the left vertebral artery. IMPRESSION: Left internal carotid artery is occluded just beyond its origin. Chronicity of this finding is unknown. Atherosclerotic disease in bilateral carotid arteries. Estimated degree of stenosis in the right internal carotid artery is less than 50%. Patent vertebral arteries. These results will be called to the ordering clinician or representative by the Radiologist Assistant, and communication documented in the PACS or zVision Dashboard. Electronically Signed   By: Markus Daft M.D.   On: 07/10/2016 15:46    Review of Systems  Constitutional: Negative for chills, fever, malaise/fatigue and weight loss.  HENT: Negative.   Eyes: Negative for blurred vision and double vision.  Respiratory: Negative for cough.   Cardiovascular: Negative for chest pain, palpitations, orthopnea and claudication.  Gastrointestinal: Negative.   Musculoskeletal: Negative for back pain and myalgias.  Skin: Negative.   Neurological: Positive for dizziness and speech  change. Negative for sensory change, focal weakness, seizures, loss of consciousness and headaches.   Blood pressure 125/68, pulse 96, temperature 98.6 F (37 C), temperature source Oral, resp. rate 19, height 5' 8"  (1.727 m), weight 85.5 kg (188 lb 9.6 oz), SpO2 92 %. Physical Exam  Constitutional: He is oriented to person, place, and time. He appears well-developed and well-nourished.  HENT:  Head: Atraumatic.  Eyes: Pupils are equal, round, and reactive to light.  Neck: Normal range of motion. Neck supple. No JVD present.  Cardiovascular: Normal rate, regular rhythm, normal heart sounds and intact distal pulses.   No murmur heard. Respiratory: Effort normal and breath sounds normal. No stridor. No respiratory distress.  GI: Soft. He exhibits no distension and no mass. There is no tenderness.  Musculoskeletal: Normal range of motion. He exhibits no edema.  Neurological: He is alert and oriented to person, place, and time. No cranial nerve deficit.  Intermittent Brocas Aphasia  Skin: Skin is warm.    Assessment/Plan:  Left Carotid Artery Occlusion.  No Vascular Surgery intervention as the carotid is completely occluded without evidence of string sign or residual flow.  Continue medical management.   Follow up outpatient to continue to monitor Right ICA.  Dakota [name], Dakota [name] 07/11/2016, 9:27 AM

## 2016-07-11 NOTE — Plan of Care (Signed)
Problem: Activity: Goal: Risk for activity intolerance will decrease Outcome: Progressing Pt ambulated 2 laps around the nursing station with PT.  Problem: Coping: Goal: Ability to verbalize positive feelings about self will improve Outcome: Progressing Pt states his speech is getting better  Problem: Nutrition: Goal: Risk of aspiration will decrease Outcome: Progressing Pt passed swallow screen. Has been eating without difficulty.

## 2016-07-11 NOTE — Progress Notes (Signed)
Pt in room with wife. Pt recovering from stroke. [facility]   07/11/16 0840  Clinical Encounter Type  Visited With Patient and family together  Visit Type Initial  Referral From Nurse  Spiritual Encounters  Spiritual Needs Prayer;Emotional  Stress Factors  Patient Stress Factors None identified  Family Stress Factors None identified   offered prayer.

## 2016-07-11 NOTE — Consult Note (Signed)
Referring Physician: Nemiah Commander    Chief Complaint: Difficulty with speech  Symptoms improved and speech as per baseline per family.    Past Medical History:  Diagnosis Date  . Anemia   . Aortic stenosis   . CAD (coronary artery disease)    s/p CABG  . Gout   . H/O aortic valve replacement   . Hyperlipidemia   . Hypertension   . Rheumatoid arthritis [facility])     Past Surgical History:  Procedure Laterality Date  . AORTIC VALVE REPLACEMENT (AVR)/CORONARY ARTERY BYPASS GRAFTING (CABG)    . CARDIAC VALVE REPLACEMENT    . CORONARY ARTERY BYPASS GRAFT    . EYE SURGERY     cataract  . forearm tendon surgery Right   . HERNIA REPAIR    . TONSILLECTOMY      Family History  Problem Relation Age of Onset  . Congestive Heart Failure Father    Social History:  reports that he has quit smoking. He has never used smokeless tobacco. He reports that he does not drink alcohol or use drugs.  Allergies:  Allergies  Allergen Reactions  . Colchicine Shortness Of Breath  . Penicillin G Rash    Medications:  I have reviewed the patient's current medications. Prior to Admission:  Prescriptions Prior to Admission  Medication Sig Dispense Refill Last Dose  . albuterol (PROVENTIL HFA;VENTOLIN HFA) 108 (90 Base) MCG/ACT inhaler Inhale 2 puffs into the lungs every 6 (six) hours as needed for wheezing or shortness of breath. 1 Inhaler 0 prn at prn  . allopurinol (ZYLOPRIM) 300 MG tablet Take 1 tablet by mouth 2 (two) times daily. Dr Lavenia Atlas   07/10/2016 at 0730  . aspirin EC 81 MG tablet Take 81 mg by mouth daily.   07/09/2016 at 1900  . furosemide (LASIX) 20 MG tablet Take 1 tablet by mouth as needed. Dr Lady Gary   prn at prn  . hydroxychloroquine (PLAQUENIL) 200 MG tablet Take 2 tablets by mouth daily. Dr Lavenia Atlas   07/10/2016 at 0730  . metoprolol tartrate (LOPRESSOR) 25 MG tablet Take 1 tablet by mouth 2 (two) times daily. Dr Lady Gary   07/10/2016 at 0730  . pravastatin (PRAVACHOL) 10 MG  tablet Take 1 tablet by mouth at bedtime. Dr Lady Gary   07/09/2016 at 1900  . predniSONE (DELTASONE) 5 MG tablet Take 5 mg by mouth daily as needed.   prn at prn  . triamcinolone cream (KENALOG) 0.1 % APPLY TO FACIAL RASH 2 TIMES DAILY FOR 1 WEEK, THEN DAILY 120 g 1 07/09/2016 at 0700   Scheduled: . aspirin EC  81 mg Oral Daily  . atorvastatin  40 mg Oral q1800  . clopidogrel  75 mg Oral Daily  . enoxaparin (LOVENOX) injection  40 mg Subcutaneous Q24H  . hydroxychloroquine  400 mg Oral Daily  . ipratropium-albuterol  3 mL Nebulization Q4H  . methylPREDNISolone (SOLU-MEDROL) injection  60 mg Intravenous Q24H  . sodium chloride flush  3 mL Intravenous Q12H    ROS: History obtained from the patient and daughter  General ROS: negative for - chills, fatigue, fever, night sweats, weight gain or weight loss Psychological ROS: negative for - behavioral disorder, hallucinations, memory difficulties, mood swings or suicidal ideation Ophthalmic ROS: poor vision right eye ENT ROS: HOH Allergy and Immunology ROS: negative for - hives or itchy/watery eyes Hematological and Lymphatic ROS: negative for - bleeding problems, bruising or swollen lymph nodes Endocrine ROS: negative for - galactorrhea, hair pattern changes, polydipsia/polyuria or  temperature intolerance Respiratory ROS: negative for - cough, hemoptysis, shortness of breath or wheezing Cardiovascular ROS: negative for - chest pain, dyspnea on exertion, edema or irregular heartbeat Gastrointestinal ROS: negative for - abdominal pain, diarrhea, hematemesis, nausea/vomiting or stool incontinence Genito-Urinary ROS: negative for - dysuria, hematuria, incontinence or urinary frequency/urgency Musculoskeletal ROS: negative for - joint swelling or muscular weakness Neurological ROS: as noted in HPI Dermatological ROS: bruising  Physical Examination: Blood pressure (!) 107/58, pulse 83, temperature 97.6 F (36.4 C), temperature source Oral, resp.  rate 18, height 5\' 8"  (1.727 m), weight 85.5 kg (188 lb 9.6 oz), SpO2 99 %.  HEENT-  Normocephalic, no lesions, without obvious abnormality.  Normal external eye and conjunctiva.  Normal TM's bilaterally.  Normal auditory canals and external ears. Normal external nose, mucus membranes and septum.  Normal pharynx. Cardiovascular- S1, S2 normal, pulses palpable throughout   Lungs- chest clear, no wheezing, rales, normal symmetric air entry Abdomen- soft, non-tender; bowel sounds normal; no masses,  no organomegaly Extremities- mild LE edema Lymph-no adenopathy palpable Musculoskeletal-no joint tenderness, deformity or swelling Skin-warm and dry, no hyperpigmentation, vitiligo, or suspicious lesions  Neurological Examination Mental Status: Alert.  Expressive and receptive aphasia noted with patient able to get some spontaneous fluent speech out at times.  Needs nonverbal cues to follow commands.  Cranial Nerves: II: Discs flat bilaterally; Does not blink to confrontation from the right, right pupil sluggishly reactive III,IV, VI: ptosis not present, extra-ocular motions intact bilaterally V,VII: smile symmetric, facial light touch sensation normal bilaterally VIII: hearing normal bilaterally IX,X: gag reflex present XI: bilateral shoulder shrug XII: midline tongue extension Motor: Right : Upper extremity   5/5    Left:     Upper extremity   5/5  Lower extremity   5/5     Lower extremity   5/5 Tone and bulk:normal tone throughout; no atrophy noted Sensory: Responds to light noxious stimuli throughout Deep Tendon Reflexes: 2+ and symmetric with absent AJ's bilaterally Plantars: Right: mute   Left: mute Cerebellar: Unable to perform due to patient's ability to follow commands Gait: not tested due to safety concerns   Laboratory Studies:  Basic Metabolic Panel:  Recent Labs Lab 07/10/16 1637 07/11/16 0435 07/11/16 1224  NA 138 138 137  K 4.9 4.2 3.8  CL 105 101 100*  CO2 28 30  28   GLUCOSE 92 206* 129*  BUN 15 23* 26*  CREATININE 1.06 1.36* 1.39*  CALCIUM 8.7* 8.9 8.8*    Liver Function Tests: No results for input(s): AST, ALT, ALKPHOS, BILITOT, PROT, ALBUMIN in the last 168 hours. No results for input(s): LIPASE, AMYLASE in the last 168 hours. No results for input(s): AMMONIA in the last 168 hours.  CBC:  Recent Labs Lab 07/10/16 1157 07/11/16 0435  WBC 8.6 5.2  NEUTROABS 5.9  --   HGB 12.5* 13.0  HCT 38.7* 40.1  MCV 94.3 93.1  PLT 153 154    Cardiac Enzymes:  Recent Labs Lab 07/10/16 1157  TROPONINI <0.03    BNP: Invalid input(s): POCBNP  CBG: No results for input(s): GLUCAP in the last 168 hours.  Microbiology: No results found for this or any previous visit.  Coagulation Studies:  Recent Labs  07/10/16 1157  LABPROT 13.0  INR 0.98    Urinalysis:   Recent Labs Lab 07/10/16 1159  COLORURINE YELLOW*  LABSPEC 1.018  PHURINE 6.0  GLUCOSEU NEGATIVE  HGBUR NEGATIVE  BILIRUBINUR NEGATIVE  KETONESUR NEGATIVE  PROTEINUR 100*  NITRITE NEGATIVE  LEUKOCYTESUR LARGE*    Lipid Panel: No results found for: CHOL, TRIG, HDL, CHOLHDL, VLDL, LDLCALC  HgbA1C:  Lab Results  Component Value Date   HGBA1C 5.9 (H) 07/10/2016    Urine Drug Screen:  No results found for: LABOPIA, COCAINSCRNUR, LABBENZ, AMPHETMU, THCU, LABBARB  Alcohol Level:   Recent Labs Lab 07/10/16 1157  ETH <5    Other results: EKG: junctional rhythm, 69 bpm.  Imaging: Dg Chest 1 View  Result Date: 07/10/2016 CLINICAL DATA:  Expressive aphasia, dyspnea EXAM: CHEST 1 VIEW COMPARISON:  06/12/2010 FINDINGS: Cardiac shadow is within normal limits. Postsurgical changes are noted consistent with bypass grafting and aortic valve replacement. The lungs are well aerated bilaterally. No focal infiltrate or sizable effusion is seen. No acute bony abnormality is noted. IMPRESSION: No active disease. Electronically Signed   By: Alcide Clever M.D.   On: 07/10/2016  16:07   Ct Head Wo Contrast  Result Date: 07/10/2016 CLINICAL DATA:  Altered mental status for 4 days.  No known injury. EXAM: CT HEAD WITHOUT CONTRAST TECHNIQUE: Contiguous axial images were obtained from the base of the skull through the vertex without intravenous contrast. COMPARISON:  None. FINDINGS: Brain: Hypoattenuation is seen in the anterior aspect of the left temporal lobe with an appearance most suspicious for subacute infarct. 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 The brain is atrophic with chronic microvascular ischemic change. Remote lacunar infarction in the left thalamus is identified. No hemorrhage, midline shift or abnormal extra-axial fluid collection is seen. Vascular: Atherosclerosis noted.  No hyperdense vessel identified. Skull: Intact. Sinuses/Orbits: Small amount of secretions in the left sphenoid sinus noted. Other: None. IMPRESSION: Hypoattenuation in the anterior left temporal lobe most consistent with early subacute to subacute infarct. Atrophy and chronic microvascular ischemic change. Remote right cerebellar infarct noted. Atherosclerosis. Electronically Signed   By: Drusilla Kanner M.D.   On: 07/10/2016 12:31   Ct Angio Neck W Or Wo Contrast  Result Date: 07/10/2016 CLINICAL DATA:  Carotid stenosis.  Acute infarct. EXAM: CT ANGIOGRAPHY NECK TECHNIQUE: Multidetector CT imaging of the neck was performed using the standard protocol during bolus administration of intravenous contrast. Multiplanar CT image reconstructions and MIPs were obtained to evaluate the vascular anatomy. Carotid stenosis measurements (when applicable) are obtained utilizing NASCET criteria, using the distal internal carotid diameter as the denominator. CONTRAST:  75 cc Isovue 370 intravenous COMPARISON:  None. FINDINGS: Aortic arch: Extensive diffuse irregular atherosclerotic plaque. Two vessel branching Right carotid system: Diffuse predominately noncalcified atheromatous wall thickening. Bulky  plaque on the common carotid and proximal ICA with proximal ICA narrowing measuring up to 50-60 percent (best measured on sagittal reformats). Maximal narrowing is in the proximal ICA below the level of the mandibular angle. No ulceration or dissection noted. Left carotid system: Diffuse noncalcified atheromatous wall thickening of the common carotid artery. Bulky plaque at the bifurcation and proximal ICA with occlusion just beyond the bifurcation. Vertebral arteries: Heavy noncalcified plaque deposition in the proximal left subclavian without flow limiting stenosis. The dominant left vertebral artery is widely patent to the dura.Non dominant right vertebral artery is occluded at its origin with thready intermittent reconstitution in the neck, faintly seen at the vertebrobasilar junction. Faint flow seen in the right PICA Skeleton: Diffuse advanced disc degeneration with kyphotic deformity of the cervical spine. At least moderate spinal stenosis greatest at C3-4. Multilevel foraminal narrowing. Other neck: No incidental mass or adenopathy. Upper chest: Subpleural nodularity in the right upper lobe dominant nodule measuring 9 mm. Paraseptal  emphysema. Status post CABG. IMPRESSION: 1. 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 Faint reconstitution seen at the cavernous ICA. 2. 50-60% proximal right ICA narrowing. 3. Non dominant right vertebral artery occlusion at the ostium with thready intermittent reconstitution. The vessel does not provide significant flow to the basilar. The dominant left vertebral artery is smooth and widely patent. 4. Advanced atherosclerosis 5. Subpleural opacity in the right upper lobe could be atelectasis, infection, or neoplasm. After stabilization, recommend diagnostic chest CT. If there are infectious signs, would image after antibiotics. Electronically Signed   By: Marnee SpringJonathon  Watts M.D.   On: 07/10/2016 19:09   Mr Maxine GlennMra Head Wo Contrast  Result Date: 07/10/2016 CLINICAL DATA:  Altered  mental status for 4 days. Aphasia. No focal weakness. EXAM: MRI HEAD WITHOUT CONTRAST MRA HEAD WITHOUT CONTRAST TECHNIQUE: Multiplanar, multiecho pulse sequences of the brain and surrounding structures were obtained without intravenous contrast. Angiographic images of the head were obtained using MRA technique without contrast. COMPARISON:  CT head without contrast 07/10/2016. FINDINGS: MRI HEAD FINDINGS Brain: The diffusion-weighted images confirm an acute nonhemorrhagic infarct involving the left temporal lobe and posterior sylvian fissure. There is also a focal area of acute infarction within the anterior left thalamus. T2 changes are associated with the areas of acute/ subacute infarction. Moderate generalized atrophy and white matter changes are present otherwise. A remote inferior right cerebellar infarct is present. Remote lacunar infarcts are present in the left cerebellum. The study is mildly degraded by patient motion. Vascular: The left internal carotid artery is occluded. It is reconstituted at the left ICA terminus. There is flow in the right internal carotid artery and both vertebral arteries. Skull and upper cervical spine: The skullbase is within normal limits. Moderate narrowing is present at C2-3 and C3-4. Midline sagittal structures are unremarkable. Sinuses/Orbits: The paranasal sinuses and mastoid air cells are clear. A right lens replacement is noted. The globes and orbits are otherwise within normal limits. MRA HEAD FINDINGS A left internal carotid artery is occluded. Flow is present in the right internal carotid artery from the high cervical segments through the ICA terminus. There is flow in the A1 segments bilaterally. The anterior communicating artery is patent. ACA branch vessels are within normal limits. There is flow in the left M1 segment. Signal is less robust than on the right. There is some irregularity in the right M1 segment. The MCA bifurcations are intact. There is attenuation  of MCA branch vessels, left greater than right. The left vertebral artery is the dominant vessel. There is signal loss in the distal right vertebral artery suggesting high-grade stenosis or occlusion. The basilar artery is normal. Both posterior cerebral arteries originate from the basilar tip. There is asymmetric attenuation of distal right PCA branch vessels. IMPRESSION: 1. Occluded left internal carotid artery. 2. Although there is flow across the anterior communicating artery, flow signal is decreased in the left M1 segment compared to the right and there is advanced attenuation of left MCA branch vessels compared to the right. 3. Moderate diffuse small vessel disease. 4. High-grade stenosis or occlusion of distal right vertebral artery. 5. Acute/subacute nonhemorrhagic infarct involving the left superior temporal gyrus and sylvian fissure 6. Acute/subacute non hemorrhagic infarct within the medial anterior left thalamus. 7. Moderate diffuse atrophy and white matter disease otherwise. 8. Remote cerebellar infarcts as described. Electronically Signed   By: Marin Robertshristopher  Mattern M.D.   On: 07/10/2016 16:15   Mr Brain Wo Contrast  Result Date: 07/10/2016 CLINICAL DATA:  Altered mental  status for 4 days. Aphasia. No focal weakness. EXAM: MRI HEAD WITHOUT CONTRAST MRA HEAD WITHOUT CONTRAST TECHNIQUE: Multiplanar, multiecho pulse sequences of the brain and surrounding structures were obtained without intravenous contrast. Angiographic images of the head were obtained using MRA technique without contrast. COMPARISON:  CT head without contrast 07/10/2016. FINDINGS: MRI HEAD FINDINGS Brain: The diffusion-weighted images confirm an acute nonhemorrhagic infarct involving the left temporal lobe and posterior sylvian fissure. There is also a focal area of acute infarction within the anterior left thalamus. T2 changes are associated with the areas of acute/ subacute infarction. Moderate generalized atrophy and white matter  changes are present otherwise. A remote inferior right cerebellar infarct is present. Remote lacunar infarcts are present in the left cerebellum. The study is mildly degraded by patient motion. Vascular: The left internal carotid artery is occluded. It is reconstituted at the left ICA terminus. There is flow in the right internal carotid artery and both vertebral arteries. Skull and upper cervical spine: The skullbase is within normal limits. Moderate narrowing is present at C2-3 and C3-4. Midline sagittal structures are unremarkable. Sinuses/Orbits: The paranasal sinuses and mastoid air cells are clear. A right lens replacement is noted. The globes and orbits are otherwise within normal limits. MRA HEAD FINDINGS A left internal carotid artery is occluded. Flow is present in the right internal carotid artery from the high cervical segments through the ICA terminus. There is flow in the A1 segments bilaterally. The anterior communicating artery is patent. ACA branch vessels are within normal limits. There is flow in the left M1 segment. Signal is less robust than on the right. There is some irregularity in the right M1 segment. The MCA bifurcations are intact. There is attenuation of MCA branch vessels, left greater than right. The left vertebral artery is the dominant vessel. There is signal loss in the distal right vertebral artery suggesting high-grade stenosis or occlusion. The basilar artery is normal. Both posterior cerebral arteries originate from the basilar tip. There is asymmetric attenuation of distal right PCA branch vessels. IMPRESSION: 1. Occluded left internal carotid artery. 2. Although there is flow across the anterior communicating artery, flow signal is decreased in the left M1 segment compared to the right and there is advanced attenuation of left MCA branch vessels compared to the right. 3. Moderate diffuse small vessel disease. 4. High-grade stenosis or occlusion of distal right vertebral  artery. 5. Acute/subacute nonhemorrhagic infarct involving the left superior temporal gyrus and sylvian fissure 6. Acute/subacute non hemorrhagic infarct within the medial anterior left thalamus. 7. Moderate diffuse atrophy and white matter disease otherwise. 8. Remote cerebellar infarcts as described. Electronically Signed   By: Marin Roberts M.D.   On: 07/10/2016 16:15   US Carotid Bilateral  Result Date: 07/10/2016 CLINICAL DATA:  CVA and altered speech. EXAM: BILATERAL CAROTID DUPLEX ULTRASOUND TECHNIQUE: Wallace Cullens scale imaging, color Doppler and duplex ultrasound were performed of bilateral carotid and vertebral arteries in the neck. COMPARISON:  Head CT 07/10/2016 FINDINGS: Criteria: Quantification of carotid stenosis is based on velocity parameters that correlate the residual internal carotid diameter with NASCET-based stenosis levels, using the diameter of the distal internal carotid lumen as the denominator for stenosis measurement. The following velocity measurements were obtained: RIGHT ICA:  104 cm/sec CCA:  88 cm/sec SYSTOLIC ICA/CCA RATIO:  1.3 DIASTOLIC ICA/CCA RATIO:  1.2 ECA:  138 cm/sec LEFT ICA:  Occluded CCA:  60 cm/sec ECA:  94 cm/sec RIGHT CAROTID ARTERY: Scattered echogenic plaque throughout the right common carotid artery.  Calcified plaque at the right carotid bulb. Plaque in the external carotid artery. External carotid artery is patent with normal waveform. Scattered plaque throughout the proximal internal carotid artery. Normal waveforms and velocities in the internal carotid artery. RIGHT VERTEBRAL ARTERY: Antegrade flow and normal waveform in the right vertebral artery. LEFT CAROTID ARTERY: Plaque in the left common carotid artery. Left common carotid artery is patent. Left carotid bulb is patent. Echogenic plaque involving the left external carotid artery. The origin of the left internal carotid artery is patent but there is a large amount of plaque just beyond the origin. Mid  and distal left internal carotid artery are occluded. LEFT VERTEBRAL ARTERY: Antegrade flow and normal waveform in the left vertebral artery. IMPRESSION: Left internal carotid artery is occluded just beyond its origin. Chronicity of this finding is unknown. Atherosclerotic disease in bilateral carotid arteries. Estimated degree of stenosis in the right internal carotid artery is less than 50%. Patent vertebral arteries. These results will be called to the ordering clinician or representative by the Radiologist Assistant, and communication documented in the PACS or zVision Dashboard. Electronically Signed   By: Richarda Overlie M.D.   On: 07/10/2016 15:46    Assessment: 82 y.o. male presenting with expressive and receptive aphasia.  No focal weakness noted.  Head CT reviewed and shows an area of hypoattenuation in the left temporal lobe consistent with infarct.  Patient on Plavix.    Pt has LICA occlusion with decreased flow in the L M1 segment that is likely retrograde from L sided circulation.    Plan: Cont ASA and plavix Pt/Ot As per family pt lives with family friend. Potential case management to see if pt can go back home as might need assistance S/p discussion with family.     Stroke Risk Factors - hyperlipidemia and hypertension  Pauletta Browns

## 2016-07-11 NOTE — Progress Notes (Signed)
Discharge instructions along with home medications and follow up gone over with patient and daughter. Both verbalize that they understood instructions. No prescriptions given to patient. IV and tele removed. Pt being discharged home on room air, no distress noted. Torey Reinard S Fenton, RN 

## 2016-07-12 NOTE — Care Management Note (Signed)
Case Management Note  Patient Details  Name: Osamu Olguin MRN: [id number] Date of Birth: 06/16/1926  Subjective/Objective:    [facility] nebulizer by [facility] was delivered to [facility] room 116. A home health referral for HH-PT, OT, Speech was faxed to [facility] per patients choice of providers.                 Action/Plan:   Expected Discharge Date:                  Expected Discharge Plan:     In-House Referral:     Discharge planning Services     Post Acute Care Choice:    Choice offered to:     DME Arranged:    DME Agency:     HH Arranged:    HH Agency:     Status of Service:     If discussed at [employer] of Stay Meetings, dates discussed:    Additional Comments:  Sierra Spargo A, RN 07/12/2016, 8:26 AM

## 2016-07-13 ENCOUNTER — Inpatient Hospital Stay (HOSPITAL_COMMUNITY)
Admission: EM | Admit: 2016-07-13 | Discharge: 2016-07-15 | DRG: 065 | Disposition: A | Discharge status: Hospice | Payer: Medicare PPO | Attending: Internal Medicine | Admitting: Internal Medicine

## 2016-07-13 ENCOUNTER — Observation Stay (HOSPITAL_COMMUNITY): Payer: Medicare PPO

## 2016-07-13 ENCOUNTER — Emergency Department (HOSPITAL_COMMUNITY): Payer: Medicare PPO

## 2016-07-13 ENCOUNTER — Encounter (HOSPITAL_COMMUNITY): Payer: Self-pay | Admitting: Emergency Medicine

## 2016-07-13 DIAGNOSIS — I6529 Occlusion and stenosis of unspecified carotid artery: Secondary | ICD-10-CM | POA: Diagnosis present

## 2016-07-13 DIAGNOSIS — Z7982 Long term (current) use of aspirin: Secondary | ICD-10-CM

## 2016-07-13 DIAGNOSIS — Z515 Encounter for palliative care: Secondary | ICD-10-CM | POA: Diagnosis present

## 2016-07-13 DIAGNOSIS — I2510 Atherosclerotic heart disease of native coronary artery without angina pectoris: Secondary | ICD-10-CM | POA: Diagnosis present

## 2016-07-13 DIAGNOSIS — Z79899 Other long term (current) drug therapy: Secondary | ICD-10-CM

## 2016-07-13 DIAGNOSIS — E784 Other hyperlipidemia: Secondary | ICD-10-CM

## 2016-07-13 DIAGNOSIS — M069 Rheumatoid arthritis, unspecified: Secondary | ICD-10-CM | POA: Diagnosis present

## 2016-07-13 DIAGNOSIS — Z7902 Long term (current) use of antithrombotics/antiplatelets: Secondary | ICD-10-CM

## 2016-07-13 DIAGNOSIS — M109 Gout, unspecified: Secondary | ICD-10-CM | POA: Diagnosis present

## 2016-07-13 DIAGNOSIS — Z8673 Personal history of transient ischemic attack (TIA), and cerebral infarction without residual deficits: Secondary | ICD-10-CM | POA: Diagnosis not present

## 2016-07-13 DIAGNOSIS — I10 Essential (primary) hypertension: Secondary | ICD-10-CM | POA: Diagnosis present

## 2016-07-13 DIAGNOSIS — I639 Cerebral infarction, unspecified: Secondary | ICD-10-CM | POA: Diagnosis present

## 2016-07-13 DIAGNOSIS — R1310 Dysphagia, unspecified: Secondary | ICD-10-CM | POA: Diagnosis present

## 2016-07-13 DIAGNOSIS — Z888 Allergy status to other drugs, medicaments and biological substances status: Secondary | ICD-10-CM

## 2016-07-13 DIAGNOSIS — Z880 Allergy status to penicillin: Secondary | ICD-10-CM

## 2016-07-13 DIAGNOSIS — J441 Chronic obstructive pulmonary disease with (acute) exacerbation: Secondary | ICD-10-CM | POA: Diagnosis present

## 2016-07-13 DIAGNOSIS — G8191 Hemiplegia, unspecified affecting right dominant side: Secondary | ICD-10-CM | POA: Diagnosis present

## 2016-07-13 DIAGNOSIS — Z8249 Family history of ischemic heart disease and other diseases of the circulatory system: Secondary | ICD-10-CM | POA: Diagnosis not present

## 2016-07-13 DIAGNOSIS — I63412 Cerebral infarction due to embolism of left middle cerebral artery: Principal | ICD-10-CM | POA: Diagnosis present

## 2016-07-13 DIAGNOSIS — Z952 Presence of prosthetic heart valve: Secondary | ICD-10-CM

## 2016-07-13 DIAGNOSIS — Z951 Presence of aortocoronary bypass graft: Secondary | ICD-10-CM | POA: Diagnosis not present

## 2016-07-13 DIAGNOSIS — Z87891 Personal history of nicotine dependence: Secondary | ICD-10-CM

## 2016-07-13 DIAGNOSIS — Z9889 Other specified postprocedural states: Secondary | ICD-10-CM

## 2016-07-13 DIAGNOSIS — Z66 Do not resuscitate: Secondary | ICD-10-CM | POA: Diagnosis present

## 2016-07-13 DIAGNOSIS — E785 Hyperlipidemia, unspecified: Secondary | ICD-10-CM | POA: Diagnosis present

## 2016-07-13 DIAGNOSIS — R4701 Aphasia: Secondary | ICD-10-CM | POA: Diagnosis present

## 2016-07-13 DIAGNOSIS — R0603 Acute respiratory distress: Secondary | ICD-10-CM

## 2016-07-13 HISTORY — DX: Cerebral infarction, unspecified: I63.9

## 2016-07-13 HISTORY — DX: Chronic obstructive pulmonary disease, unspecified: J44.9

## 2016-07-13 LAB — PROTIME-INR
INR: 1
INR: 1.06
PROTHROMBIN TIME: 13.9 s (ref 11.4–15.2)
Prothrombin Time: 13.2 seconds (ref 11.4–15.2)

## 2016-07-13 LAB — CBC
HEMATOCRIT: 39.8 % (ref 39.0–52.0)
HEMOGLOBIN: 12.5 g/dL — AB (ref 13.0–17.0)
MCH: 29.6 pg (ref 26.0–34.0)
MCHC: 31.4 g/dL (ref 30.0–36.0)
MCV: 94.1 fL (ref 78.0–100.0)
Platelets: 188 10*3/uL (ref 150–400)
RBC: 4.23 MIL/uL (ref 4.22–5.81)
RDW: 15.2 % (ref 11.5–15.5)
WBC: 11.8 10*3/uL — ABNORMAL HIGH (ref 4.0–10.5)

## 2016-07-13 LAB — COMPREHENSIVE METABOLIC PANEL
ALT: 15 U/L — AB (ref 17–63)
AST: 23 U/L (ref 15–41)
Albumin: 3.5 g/dL (ref 3.5–5.0)
Alkaline Phosphatase: 46 U/L (ref 38–126)
Anion gap: 8 (ref 5–15)
BUN: 21 mg/dL — AB (ref 6–20)
CO2: 30 mmol/L (ref 22–32)
CREATININE: 1.24 mg/dL (ref 0.61–1.24)
Calcium: 9.2 mg/dL (ref 8.9–10.3)
Chloride: 102 mmol/L (ref 101–111)
GFR, EST AFRICAN AMERICAN: 58 mL/min — AB (ref >60)
GFR, EST NON AFRICAN AMERICAN: 50 mL/min — AB (ref >60)
Glucose, Bld: 106 mg/dL — ABNORMAL HIGH (ref 65–99)
POTASSIUM: 4.2 mmol/L (ref 3.5–5.1)
Sodium: 140 mmol/L (ref 135–145)
TOTAL PROTEIN: 6.6 g/dL (ref 6.5–8.1)
Total Bilirubin: 0.8 mg/dL (ref 0.3–1.2)

## 2016-07-13 LAB — I-STAT CHEM 8, ED
BUN: 24 mg/dL — AB (ref 6–20)
CALCIUM ION: 1.17 mmol/L (ref 1.15–1.40)
CREATININE: 1.2 mg/dL (ref 0.61–1.24)
Chloride: 101 mmol/L (ref 101–111)
Glucose, Bld: 104 mg/dL — ABNORMAL HIGH (ref 65–99)
HCT: 42 % (ref 39.0–52.0)
Hemoglobin: 14.3 g/dL (ref 13.0–17.0)
Potassium: 4.2 mmol/L (ref 3.5–5.1)
Sodium: 139 mmol/L (ref 135–145)
TCO2: 27 mmol/L (ref 0–100)

## 2016-07-13 LAB — TROPONIN I: Troponin I: 0.03 ng/mL (ref <0.03)

## 2016-07-13 LAB — I-STAT TROPONIN, ED: TROPONIN I, POC: 0 ng/mL (ref 0.00–0.08)

## 2016-07-13 LAB — DIFFERENTIAL
BASOS ABS: 0 10*3/uL (ref 0.0–0.1)
BASOS PCT: 0 %
EOS ABS: 0.5 10*3/uL (ref 0.0–0.7)
Eosinophils Relative: 4 %
LYMPHS ABS: 1.4 10*3/uL (ref 0.7–4.0)
Lymphocytes Relative: 12 %
MONO ABS: 1.5 10*3/uL — AB (ref 0.1–1.0)
MONOS PCT: 13 %
Neutro Abs: 8.4 10*3/uL — ABNORMAL HIGH (ref 1.7–7.7)
Neutrophils Relative %: 71 %

## 2016-07-13 LAB — APTT
APTT: 28 s (ref 24–36)
APTT: 37 s — AB (ref 24–36)

## 2016-07-13 LAB — CBG MONITORING, ED: Glucose-Capillary: 106 mg/dL — ABNORMAL HIGH (ref 65–99)

## 2016-07-13 MED ORDER — ACETAMINOPHEN 650 MG RE SUPP: 650.0000 mg | RECTAL | Status: DC | PRN

## 2016-07-13 MED ORDER — SODIUM CHLORIDE 0.9 % IV SOLN
INTRAVENOUS | Status: DC
Start: 2016-07-13 — End: 2016-07-15
  Administered 2016-07-13 – 2016-07-15 (×3): via INTRAVENOUS

## 2016-07-13 MED ORDER — ATORVASTATIN CALCIUM 40 MG PO TABS: 40.0000 mg | ORAL_TABLET | Freq: Every day | ORAL | Status: DC

## 2016-07-13 MED ORDER — HYDROXYCHLOROQUINE SULFATE 200 MG PO TABS
400.0000 mg | ORAL_TABLET | Freq: Every day | ORAL | Status: DC
  Administered 2016-07-14: 400 mg via ORAL
  Filled 2016-07-13 (×2): qty 2

## 2016-07-13 MED ORDER — ACETAMINOPHEN 160 MG/5ML PO SOLN
650.0000 mg | ORAL | Status: DC | PRN
Start: 2016-07-13 — End: 2016-07-15

## 2016-07-13 MED ORDER — ENOXAPARIN SODIUM 40 MG/0.4ML ~~LOC~~ SOLN: 40.0000 mg | SUBCUTANEOUS | Status: DC

## 2016-07-13 MED ORDER — CHLORHEXIDINE GLUCONATE 0.12 % MT SOLN
15.0000 mL | Freq: Two times a day (BID) | OROMUCOSAL | Status: DC
  Administered 2016-07-13 – 2016-07-15 (×4): 15 mL via OROMUCOSAL
  Filled 2016-07-13 (×3): qty 15

## 2016-07-13 MED ORDER — ENOXAPARIN SODIUM 40 MG/0.4ML ~~LOC~~ SOLN
40.0000 mg | SUBCUTANEOUS | Status: DC
  Administered 2016-07-13: 40 mg via SUBCUTANEOUS
  Filled 2016-07-13: qty 0.4

## 2016-07-13 MED ORDER — IPRATROPIUM-ALBUTEROL 0.5-2.5 (3) MG/3ML IN SOLN
3.0000 mL | RESPIRATORY_TRACT | Status: DC
  Administered 2016-07-13: 3 mL via RESPIRATORY_TRACT
  Filled 2016-07-13: qty 3

## 2016-07-13 MED ORDER — SODIUM CHLORIDE 0.9 % IV BOLUS (SEPSIS)
1000.0000 mL | Freq: Once | INTRAVENOUS | Status: AC
  Administered 2016-07-13: 1000 mL via INTRAVENOUS

## 2016-07-13 MED ORDER — ORAL CARE MOUTH RINSE
15.0000 mL | Freq: Two times a day (BID) | OROMUCOSAL | Status: DC
  Administered 2016-07-14 – 2016-07-15 (×3): 15 mL via OROMUCOSAL

## 2016-07-13 MED ORDER — METOPROLOL TARTRATE 25 MG PO TABS: 25.0000 mg | ORAL_TABLET | Freq: Two times a day (BID) | ORAL | Status: DC

## 2016-07-13 MED ORDER — IPRATROPIUM-ALBUTEROL 0.5-2.5 (3) MG/3ML IN SOLN
3.0000 mL | RESPIRATORY_TRACT | Status: DC
  Administered 2016-07-13 – 2016-07-15 (×12): 3 mL via RESPIRATORY_TRACT
  Filled 2016-07-13 (×12): qty 3

## 2016-07-13 MED ORDER — CLOPIDOGREL BISULFATE 75 MG PO TABS
75.0000 mg | ORAL_TABLET | Freq: Every day | ORAL | Status: DC
  Administered 2016-07-14: 75 mg via ORAL
  Filled 2016-07-13: qty 1

## 2016-07-13 MED ORDER — SENNOSIDES-DOCUSATE SODIUM 8.6-50 MG PO TABS
1.0000 | ORAL_TABLET | Freq: Every evening | ORAL | Status: DC | PRN
  Filled 2016-07-13: qty 1

## 2016-07-13 MED ORDER — ALLOPURINOL 100 MG PO TABS
300.0000 mg | ORAL_TABLET | Freq: Two times a day (BID) | ORAL | Status: DC
  Administered 2016-07-14 (×2): 300 mg via ORAL
  Filled 2016-07-13 (×3): qty 3

## 2016-07-13 MED ORDER — ACETAMINOPHEN 325 MG PO TABS: 650.0000 mg | ORAL_TABLET | ORAL | Status: DC | PRN

## 2016-07-13 MED ORDER — ASPIRIN EC 81 MG PO TBEC
81.0000 mg | DELAYED_RELEASE_TABLET | Freq: Every day | ORAL | Status: DC
  Administered 2016-07-14: 81 mg via ORAL
  Filled 2016-07-13: qty 1

## 2016-07-13 NOTE — ED Provider Notes (Signed)
[facility]-EMERGENCY DEPT Provider Note   CSN: [id number] Arrival date & time: 07/13/16  1045   An emergency department physician performed an initial assessment on this suspected stroke patient at 1047.  History   Chief Complaint Chief Complaint  Patient presents with  . Code Stroke   Level V caveat: Dysarthria  HPI Dakota [name] is a 82 y.o. male.  HPI Patient presents to the emergency department after development of new right-sided weakness and difficulty with speech.  His speech resembled became more and she last night.  Patient was recently discharged from the hospital on December 5 after a recent stroke and found to have 100% occlusion of the carotid artery.  He was discharged home doing better.  He is seen by vascular surgery at that time and the carotid was not thought to be amendable to surgery.  He presents back now as a code stroke with new acute symptoms.   Past Medical History:  Diagnosis Date  . Anemia   . Aortic stenosis   . CAD (coronary artery disease)    s/p CABG  . Gout   . H/O aortic valve replacement   . Hyperlipidemia   . Hypertension   . Rheumatoid arthritis (HCC)   . Stroke [facility])     Patient Active Problem List   Diagnosis Date Noted  . 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 Expressive aphasia 07/11/2016  . Acute renal insufficiency 07/11/2016  . Carotid stenosis 07/11/2016  . Occlusion and stenosis of vertebral artery 07/11/2016  . COPD exacerbation (HCC) 07/11/2016  . Acute bronchitis 07/11/2016  . Receptive aphasia 07/11/2016  . Pyuria 07/11/2016  . CVA (cerebral vascular accident) (HCC) 07/10/2016  . Lymphedema 06/01/2016  . Atherosclerosis of coronary artery of native heart without angina pectoris 05/12/2016  . Essential hypertension 05/12/2016  . Hyperlipidemia 05/12/2016  . Pain in limb 05/12/2016  . Swelling of limb 05/12/2016    Past Surgical History:  Procedure Laterality Date  . AORTIC VALVE REPLACEMENT (AVR)/CORONARY ARTERY  BYPASS GRAFTING (CABG)    . CARDIAC VALVE REPLACEMENT    . CORONARY ARTERY BYPASS GRAFT    . EYE SURGERY     cataract  . forearm tendon surgery Right   . HERNIA REPAIR    . TONSILLECTOMY         Home Medications    Prior to Admission medications   Medication Sig Start Date End Date Taking? Authorizing Provider  albuterol (PROVENTIL HFA;VENTOLIN HFA) 108 (90 Base) MCG/ACT inhaler Inhale 2 puffs into the lungs every 6 (six) hours as needed for wheezing or shortness of breath. 05/01/16  Yes Duanne Limerick, MD  allopurinol (ZYLOPRIM) 300 MG tablet Take 1 tablet by mouth 2 (two) times daily. Dr Lavenia Atlas 09/18/15  Yes Historical Provider, MD  aspirin EC 81 MG tablet Take 81 mg by mouth daily.   Yes Historical Provider, MD  atorvastatin (LIPITOR) 40 MG tablet Take 1 tablet (40 mg total) by mouth daily at 6 PM. 07/11/16  Yes Katharina Caper, MD  clopidogrel (PLAVIX) 75 MG tablet Take 1 tablet (75 mg total) by mouth daily. 07/11/16  Yes Katharina Caper, MD  furosemide (LASIX) 20 MG tablet Take 1 tablet by mouth as needed. Dr Lady Gary 05/15/14  Yes Historical Provider, MD  hydroxychloroquine (PLAQUENIL) 200 MG tablet Take 2 tablets by mouth daily. Dr Lavenia Atlas 09/18/15  Yes Historical Provider, MD  ipratropium-albuterol (DUONEB) 0.5-2.5 (3) MG/3ML SOLN Take 3 mLs by nebulization every 4 (four) hours. 07/11/16  Yes Katharina Caper,  MD  levofloxacin (LEVAQUIN) 750 MG tablet Take 1 tablet (750 mg total) by mouth daily. 07/11/16  Yes Katharina Caper, MD  metoprolol tartrate (LOPRESSOR) 25 MG tablet Take 1 tablet by mouth 2 (two) times daily. Dr Lady Gary 09/18/15  Yes Historical Provider, MD  predniSONE (DELTASONE) 5 MG tablet Take 5 mg by mouth daily as needed.   Yes Historical Provider, MD  predniSONE (STERAPRED UNI-PAK 21 TAB) 10 MG (21) TBPK tablet Take 1 tablet (10 mg total) by mouth daily. Please take 6 pills in the morning on the day one, then taper by 1 pill daily until finished, thank you 07/11/16  Yes Katharina Caper, MD  triamcinolone cream (KENALOG) 0.1 % APPLY TO FACIAL RASH 2 TIMES DAILY FOR 1 WEEK, THEN DAILY 01/20/16  Yes Duanne Limerick, MD    Family History Family History  Problem Relation Age of Onset  . Congestive Heart Failure Father     Social History Social History  Substance Use Topics  . Smoking status: Former Games developer  . Smokeless tobacco: Never Used     Comment: Quit about 25 hyears ago  . Alcohol use No     Allergies   Colchicine and Penicillin g   Review of Systems Review of Systems  Unable to perform ROS: Patient nonverbal     Physical Exam Updated Vital Signs BP 138/75   Pulse 66   Temp 97.5 F (36.4 C) (Oral)   Resp 16   Wt 185 lb 13.6 oz (84.3 kg)   SpO2 96%   BMI 28.26 kg/m   Physical Exam  Constitutional: He appears well-developed and well-nourished.  HENT:  Head: Normocephalic and atraumatic.  Eyes: EOM are normal.  Neck: Normal range of motion.  Cardiovascular: Normal rate, regular rhythm, normal heart sounds and intact distal pulses.   Pulmonary/Chest: Effort normal and breath sounds normal. No respiratory distress.  Abdominal: Soft. He exhibits no distension. There is no tenderness.  Musculoskeletal: Normal range of motion.  Neurological: He is alert.  Dysarthric speech.  Receptive aphasia.  Difficulty performing complete neurologic exam secondary to receptive aphasia.  Right-sided neglect.  Skin: Skin is warm and dry.  Psychiatric: He has a normal mood and affect. Judgment normal.  Nursing note and vitals reviewed.    ED Treatments / Results  Labs (all labs ordered are listed, but only abnormal results are displayed) Labs Reviewed  CBC - Abnormal; Notable for the following:       Result Value   WBC 11.8 (*)    Hemoglobin 12.5 (*)    All other components within normal limits  DIFFERENTIAL - Abnormal; Notable for the following:    Neutro Abs 8.4 (*)    Monocytes Absolute 1.5 (*)    All other components within normal limits    COMPREHENSIVE METABOLIC PANEL - Abnormal; Notable for the following:    Glucose, Bld 106 (*)    BUN 21 (*)    ALT 15 (*)    GFR calc non Af Amer 50 (*)    GFR calc Af Amer 58 (*)    All other components within normal limits  CBG MONITORING, ED - Abnormal; Notable for the following:    Glucose-Capillary 106 (*)    All other components within normal limits  I-STAT CHEM 8, ED - Abnormal; Notable for the following:    BUN 24 (*)    Glucose, Bld 104 (*)    All other components within normal limits  PROTIME-INR  APTT  PROTIME-INR  APTT  TROPONIN I  RAPID URINE DRUG SCREEN, HOSP PERFORMED  I-STAT TROPOININ, ED    EKG  EKG Interpretation  Date/Time:  Monday July 13 2016 11:02:42 EST Ventricular Rate:  69 PR Interval:    QRS Duration: 104 QT Interval:  437 QTC Calculation: 469 R Axis:   60 Text Interpretation:  Sinus rhythm Nonspecific repol abnormality, inferior leads Borderline ST elevation, lateral leads No significant change was found Confirmed by Donnavan Covault  MD, Caryn Bee ([id number]) on 07/13/2016 2:31:57 PM       Radiology Ct Head Code Stroke W/o Cm  Result Date: 07/13/2016 CLINICAL DATA:  Code stroke. 82 year old male with right side weakness and aphasia. However, known left ICA occlusion and abnormal left MCA with some left MCA territory infarcts since 07/10/2016 [facility] CTA, MRI, and MRA studies. EXAM: CT HEAD WITHOUT CONTRAST TECHNIQUE: Contiguous axial images were obtained from the base of the skull through the vertex without intravenous contrast. COMPARISON:  Noncontrast head CT, Brain MRI and intracranial MRA 07/10/2016, followed by CTA neck 07/10/2016 FINDINGS: Brain: Stable appearance of left temporal lobe and posterior left insula cortically based infarct which was restricted on diffusion 07/10/2016. New 2 cm area of cytotoxic edema at the junction of the posterior left temporal and left occipital lobes on series 21, image 17 today. Of note, lacunar  type infarct in the ventral left thalamus is slightly less hypodense today. Superimposed chronic right cerebellar PICA territory infarct. No other areas of acute cytotoxic edema. No acute intracranial hemorrhage identified. Stable ventricle size and configuration. No midline shift, mass effect, or evidence of intracranial mass lesion. Vascular: Hyperdense distal left MCA M1 and left MCA bifurcation, new from the presentation head CT on 07/10/2016. Abnormal left M1 on MRA that day. Superimposed Calcified atherosclerosis at the skull base. Skull: No acute osseous abnormality identified. Sinuses/Orbits: Left sphenoid sinus bubbly opacity is stable. Other Visualized paranasal sinuses and mastoids are stable and well pneumatized. Other: No acute orbit or scalp soft tissue finding. Small midline occipital scalp lipoma. ASPECTS [facility] Stroke Program Early CT Score) Total score (0-10 with 10 being normal): Not really applicable considering there is a mix of recent core infarct and new cytotoxic edema in the left MCA territory in the setting of left ICA occlusion for at least 3 days. IMPRESSION: 1. Suspect worsening left MCA flow/occlusion since the 07/10/2016 imaging studies. Core infarct in the anterior left temporal lobe appears stable from the MRI 3 days ago, but the left MCA bifurcation now is hyperdense and there is a new 2 cm area of cytotoxic edema in the posterior left temporal/left occipital confluence. 2. No associated hemorrhage or intracranial mass effect. 3. ASPECTS is not really applicable in this clinical setting, and the constellation of findings was discussed by telephone with Dr. Ritta Slot on 07/13/2016 at 1058 hours. Electronically Signed   By: Odessa Fleming M.D.   On: 07/13/2016 11:13    Procedures Procedures (including critical care time)  Medications Ordered in ED Medications  atorvastatin (LIPITOR) tablet 40 mg (not administered)  clopidogrel (PLAVIX) tablet 75 mg (not administered)    ipratropium-albuterol (DUONEB) 0.5-2.5 (3) MG/3ML nebulizer solution 3 mL (not administered)  allopurinol (ZYLOPRIM) tablet 300 mg (not administered)  aspirin EC tablet 81 mg (not administered)  hydroxychloroquine (PLAQUENIL) tablet 400 mg (not administered)  metoprolol tartrate (LOPRESSOR) tablet 25 mg (not administered)  0.9 %  sodium chloride infusion (not administered)  acetaminophen (TYLENOL) tablet 650 mg (not administered)    Or  acetaminophen (TYLENOL) solution 650 mg (not administered)    Or  acetaminophen (TYLENOL) suppository 650 mg (not administered)  senna-docusate (Senokot-S) tablet 1 tablet (not administered)  enoxaparin (LOVENOX) injection 40 mg (not administered)  sodium chloride 0.9 % bolus 1,000 mL (1,000 mLs Intravenous New Bag/Given 07/13/16 1136)     Initial Impression / Assessment and Plan / ED Course  I have reviewed the triage vital signs and the nursing notes.  Pertinent labs & imaging results that were available during my care of the patient were reviewed by me and considered in my medical decision making (see chart for details).  Clinical Course     Code stroke.  Patient is not a candidate for TPA.  Neurology believes this is likely low flow through his carotid artery.  Patient will be admitted to the hospital.  Please see neurology consultation note for complete details  Final Clinical Impressions(s) / ED Diagnoses   Final diagnoses:  Cerebrovascular accident (CVA), unspecified mechanism (HCC)  Aphasia  Respiratory distress    New Prescriptions New Prescriptions   No medications on file     Azalia Bilis, MD 07/13/16 1432

## 2016-07-13 NOTE — ED Triage Notes (Signed)
Pt with recently stroke. Discharged from hospital 12/5. Per EMS, pt had no deficits. Went to bed last night between 9-10pm feeling normal. Woke up 6am with aphasia, right sided weakness, unable to walk. BP 146/71, HR 70 SR, CBG 122. Pt alert upon arrival

## 2016-07-13 NOTE — Evaluation (Signed)
Physical Therapy Evaluation Patient Details Name: Dakota [name] MRN: [id number] DOB: 15-Nov-1925 Today's Date: 07/13/2016   History of Present Illness  Patient is an 82 yo male admitted 07/13/16 with Rt-sided weakness, aphasia, Lt gaze preference.  CT showed patient had extension of prior CVA with involvement of Lt parieto-occipital region.    PMH:  recent admit 07/07/16-07/11/16 to [facility] with CVA,  COPD, anemia, AS s/p AVR, CAD, CABG, HLD, HTN, RA  Clinical Impression  Patient presents with problems listed below.  Will benefit from acute PT to maximize functional mobility prior to discharge.  Recommend SNF at d/c for continued therapy.    Follow Up Recommendations SNF;Supervision/Assistance - 24 hour    Equipment Recommendations  None recommended by PT    Recommendations for Other Services       Precautions / Restrictions Precautions Precautions: Fall Restrictions Weight Bearing Restrictions: No      Mobility  Bed Mobility Overal bed mobility: Needs Assistance Bed Mobility: Supine to Sit;Sit to Supine     Supine to sit: Max assist Sit to supine: Mod assist   General bed mobility comments: Verbal, tactile, and visual cues to move to EOB.  Patient unable to follow commands to initiate movement.  Required max assist to move LE's off of bed and raise trunk to sitting position.  Use of bed pad to assist patient to EOB in sitting.  Patient able to sit EOB with min guard assist for safety.  Patient with good sitting balance.  Noted movement of RUE and RLE during mobility.  Patient able to follow PT visually just past midline.  Patient required mod assist to return to supine, assist to bring LE's onto bed.  Transfers                 General transfer comment: NT  Ambulation/Gait                Stairs            Wheelchair Mobility    Modified Rankin (Stroke Patients Only) Modified Rankin (Stroke Patients Only) Pre-Morbid Rankin Score: Moderate  disability Modified Rankin: Severe disability     Balance Overall balance assessment: Needs assistance Sitting-balance support: No upper extremity supported;Feet supported Sitting balance-Leahy Scale: Fair                                       Pertinent Vitals/Pain Pain Assessment: Faces Faces Pain Scale: No hurt    Home Living Family/patient expects to be discharged to:: Private residence Living Arrangements: Spouse/significant other Available Help at Discharge: Family Type of Home: Mobile home Home Access: Stairs to enter Entrance Stairs-Rails: Doctor, general practice of Steps: 4 Home Layout: One level Home Equipment: Environmental consultant - 2 wheels;Cane - single point;Transport chair;Shower seat      Prior Function Level of Independence: Needs assistance   Gait / Transfers Assistance Needed: Limited communitydistances without AD  ADL's / Homemaking Assistance Needed: some assistance with getting pants donned   Comments: Information on PLOF from prior to CVA on 07/07/16.       Hand Dominance   Dominant Hand: Right    Extremity/Trunk Assessment   Upper Extremity Assessment: Defer to OT evaluation           Lower Extremity Assessment: Generalized weakness;Difficult to assess due to impaired cognition (Difficult to assess due to impaired communication)         Communication  Communication: Expressive difficulties;Receptive difficulties;HOH  Cognition Arousal/Alertness: Awake/alert Behavior During Therapy: Anxious;WFL for tasks assessed/performed Overall Cognitive Status: Difficult to assess                 General Comments: Patient unable to follow commands during session.    General Comments      Exercises     Assessment/Plan    PT Assessment Patient needs continued PT services  PT Problem List Decreased strength;Decreased activity tolerance;Decreased balance;Decreased mobility;Decreased cognition;Decreased knowledge of use of  DME;Cardiopulmonary status limiting activity          PT Treatment Interventions DME instruction;Gait training;Functional mobility training;Therapeutic activities;Therapeutic exercise;Balance training;Cognitive remediation;Patient/family education    PT Goals (Current goals can be found in the Care Plan section)  Acute Rehab PT Goals Patient Stated Goal: Unable to state PT Goal Formulation: With patient/family Time For Goal Achievement: 07/27/16 Potential to Achieve Goals: Fair    Frequency Min 2X/week   Barriers to discharge        Co-evaluation               End of Session   Activity Tolerance: Patient limited by fatigue Patient left: in bed;with call bell/phone within reach;with bed alarm set;with family/visitor present           Time: 4158-3094 PT Time Calculation (min) (ACUTE ONLY): 15 min   Charges:   PT Evaluation $PT Eval Moderate Complexity: 1 Procedure     PT G Codes:        Vena Austria 07-24-16, 6:52 PM Durenda Hurt. Renaldo Fiddler, [facility] Acute Rehab Services Pager [phone]

## 2016-07-13 NOTE — Consult Note (Signed)
Neurology Consultation Reason for Consult: Aphasia Referring Physician: Patria Mane, K  CC: Aphasia  History is obtained from: Family  HPI: Dakota [name] is a 82 y.o. male who [facility] acute left [facility] occlusion. He did well there, with some improvement in his aphasia and was discharged home Saturday. He did well on Sunday, however this morning on awakening he was completely aphasic. He was therefore brought then via [facility] as a code stroke..   LKW: 12/5 tpa given?: no, outside of window    ROS: Unable to obtain due to altered mental status.   Past Medical History:  Diagnosis Date  . Anemia   . Aortic stenosis   . CAD (coronary artery disease)    s/p CABG  . Gout   . H/O aortic valve replacement   . Hyperlipidemia   . Hypertension   . Rheumatoid arthritis (HCC)   . Stroke [facility])      Family History  Problem Relation Age of Onset  . Congestive Heart Failure Father      Social History:  reports that he has quit smoking. He has never used smokeless tobacco. He reports that he does not drink alcohol or use drugs.   Exam: Current vital signs: BP 138/75   Pulse 66   Temp 97.5 F (36.4 C) (Oral)   Resp 16   Wt 84.3 kg (185 lb 13.6 oz)   SpO2 96%   BMI 28.26 kg/m  Vital signs in last 24 hours: Temp:  [97.5 F (36.4 C)] 97.5 F (36.4 C) (12/11 1105) Pulse Rate:  [65-69] 66 (12/11 1315) Resp:  [15-21] 16 (12/11 1315) BP: (121-151)/(56-75) 138/75 (12/11 1315) SpO2:  [95 %-100 %] 96 % (12/11 1315) Weight:  [84.3 kg (185 lb 13.6 oz)] 84.3 kg (185 lb 13.6 oz) (12/11 1104)   Physical Exam  Constitutional: Appears well-developed and well-nourished.  Psych: Affect appropriate to situation Eyes: No scleral injection HENT: No OP obstrucion Head: Normocephalic.  Cardiovascular: Normal rate and regular rhythm.  Respiratory: Effort normal GI: Soft.  No distension. There is no tenderness.  Skin:  WDI  Neuro: Mental Status: Patient is Awake, but does not follow commands, he does have some verbal output but it does not words, rather gibberish. Cranial Nerves: II: Visual Fields he has a right hemianopia. Pupils are equal, round, and reactive to light.   III,IV, VI: He has a left gaze preference, but does cross midline to the right. V: Facial sensation is symmetric to temperature VII: Facial movement is symmetric.  VIII: hearing is intact to voice X: Uvula elevates symmetrically XI: Shoulder shrug is symmetric. XII: tongue is midline without atrophy or fasciculations.  Motor: Tone is normal. Bulk is normal. 5/5 strength was present in on the left side,? Mild right arm weakness but this is not definite. Sensory: He responds to noxious stimuli in all 4 extremities, but less on the right Cerebellar: He does not cooperate with testing, but no clear ataxia  I have reviewed labs in epic and the results pertinent to this consultation are: CMP-unremarkable other than borderline creatinine  I have reviewed the images obtained: CT head-extension of previous infarct with involvement of the parieto-occipital region  Impression: 82 year old male with extension of his previous infarct in the setting of carotid occlusion with MCA embolism. I do wonder if he might of had an episode of hypotension overnight resulting and hypoperfusion and infarct extension.  Unfortunately, with previous infarct and now 6 days out  from onset of symptoms, I don't think that he is an interventional candidate at this time. I would favor close monitoring on the stroke unit(52M).  Recommendations: 1) normal saline bolus, would use fluids to assist with blood pressure management 2) continue aspirin and Plavix 3) likely will need speech language evaluation and therapy 4) I would favor holding BP meds for now(e.g. Metoprolol) 5) Stroke team to follow.    Ritta Slot, MD [facility] [phone]  If 7pm- 7am, please page neurology on call as listed in AMION.

## 2016-07-13 NOTE — ED Notes (Signed)
Attempted report 

## 2016-07-13 NOTE — Progress Notes (Signed)
RN called RT to pts room for assessment. Per pt and pt's wife, Pt's biggest complaint is congestion at this point. Pt getting duo q4. Pt with Rhonchi/ exp wheeze LUL, diminished throughout. Pt could benefit from mucinex and possibly steroids. Pt in no distress at this time, on Room Air. RT passed on to RN. RT will continue to closely monitor pt.

## 2016-07-13 NOTE — ED Notes (Signed)
Assisted patient with urinal but patient was unsuccessful; visitors at bedside

## 2016-07-13 NOTE — Progress Notes (Signed)
CRITICAL VALUE ALERT  Critical value received:  Troponin 0.03   Date of notification:  07/13/16   Time of notification:  1837   Critical value read back:Yes.    Nurse who received alert:  Haig Prophet, RN  MD notified (1st page):  Dr. Teresa Coombs  Time of first page:  [phone]

## 2016-07-13 NOTE — Progress Notes (Signed)
Patient admitted to 5M11. Safety measures in place. Family updated on plan of care.

## 2016-07-13 NOTE — ED Notes (Signed)
Pt failed swallow screen. NIH 10. Next q2hr neuro check due at 1513. Report has been called.

## 2016-07-13 NOTE — ED Notes (Signed)
Pt CBG, 106. Nurse was notified. 

## 2016-07-13 NOTE — ED Notes (Signed)
Pt's family at bedside. Pt's wife states he started having some intermittent slurred speech last night. Pt wet the bed which is not normal for him as well. This morning his speech was worse and he was unable to use a fork per pt's wife.

## 2016-07-13 NOTE — H&P (Signed)
History and Physical    Vidal Lampkins KYH:[id number] DOB: 10-Apr-1926 DOA: 07/13/2016   PCP: Elizabeth Sauer, MD   Patient coming from:  Home  Chief Complaint: Stroke   HPI: Dakota [name] is a 82 y.o. male with recent admission for stroke from December 5, discharged on December 9, after presenting with new right sided weakness, and speech difficulty, in the setting of 100 % L  carotid artery occlusion. He underwent extensive workup at the hospital and his symptoms had improved upon discharge. At the time, he was not deemed to be a surgical candidate due to age  and multiple commorbidities. He presented today with similar symptoms including receptive and expressive aphasia, and right sided weakness. He appeared confused today.  He was having difficulty swallowing as well. Other history cannot be obtained, but family reports  His clinica status has declined. He wa son ASA And Plavix code stroke was called, the patient is noted candidate 14 PA. Neurology believes that this is likely low flow through his carotid artery. He is being admitted for further evaluation. Family is interested in proceeding with palliative care consultation, for goals of care.  ED Course:  BP (!) 146/96 (BP Location: Left Arm)   Pulse 75   Temp 97.7 F (36.5 C) (Oral)   Resp 16   Wt 84.3 kg (185 lb 13.6 oz)   SpO2 98%   BMI 28.26 kg/m    white count 11.8 hemoglobin 12.5 creatinine 1.2 glucose 104 EKG with sinus rhythm, no ACS. There is no active cardiopulmonary disease CT of the head Suspect worsening left MCA flow/occlusion since the 07/10/2016 imaging studies Core infarct in the anterior left temporal lobe appears stable from the MRI 3 days ago, but the left MCA bifurcation now is hyperdense and there is a new 2 cm area of cytotoxic edema in the posterior left temporal/left occipital confluence. 2. No associated hemorrhage or intracranial mass effect   Review of Systems: As per HPI otherwise rest of ROS cannot be  obtained   Past Medical History:  Diagnosis Date  . Anemia   . Aortic stenosis   . CAD (coronary artery disease)    s/p CABG  . Gout   . H/O aortic valve replacement   . Hyperlipidemia   . Hypertension   . Rheumatoid arthritis (HCC)   . Stroke [facility])     Past Surgical History:  Procedure Laterality Date  . AORTIC VALVE REPLACEMENT (AVR)/CORONARY ARTERY BYPASS GRAFTING (CABG)    . CARDIAC VALVE REPLACEMENT    . CORONARY ARTERY BYPASS GRAFT    . EYE SURGERY     cataract  . forearm tendon surgery Right   . HERNIA REPAIR    . TONSILLECTOMY      Social History Social History   Social History  . Marital status: Single    Spouse name: N/A  . Number of children: N/A  . Years of education: N/A   Occupational History  . Not on file.   Social History Main Topics  . Smoking status: Former Games developer  . Smokeless tobacco: Never Used     Comment: Quit about 25 hyears ago  . Alcohol use No  . Drug use: No  . Sexual activity: Not on file   Other Topics Concern  . Not on file   Social History Narrative  . No narrative on file     Allergies  Allergen Reactions  . Colchicine Shortness Of Breath  . Penicillin G Rash    Family History  Problem Relation Age of Onset  . Congestive Heart Failure Father       Prior to Admission medications   Medication Sig Start Date End Date Taking? Authorizing Provider  albuterol (PROVENTIL HFA;VENTOLIN HFA) 108 (90 Base) MCG/ACT inhaler Inhale 2 puffs into the lungs every 6 (six) hours as needed for wheezing or shortness of breath. 05/01/16  Yes Duanne Limerick, MD  allopurinol (ZYLOPRIM) 300 MG tablet Take 1 tablet by mouth 2 (two) times daily. Dr Lavenia Atlas 09/18/15  Yes Historical Provider, MD  aspirin EC 81 MG tablet Take 81 mg by mouth daily.   Yes Historical Provider, MD  atorvastatin (LIPITOR) 40 MG tablet Take 1 tablet (40 mg total) by mouth daily at 6 PM. 07/11/16  Yes Katharina Caper, MD  clopidogrel (PLAVIX) 75 MG tablet Take 1  tablet (75 mg total) by mouth daily. 07/11/16  Yes Katharina Caper, MD  furosemide (LASIX) 20 MG tablet Take 1 tablet by mouth as needed. Dr Lady Gary 05/15/14  Yes Historical Provider, MD  hydroxychloroquine (PLAQUENIL) 200 MG tablet Take 2 tablets by mouth daily. Dr Lavenia Atlas 09/18/15  Yes Historical Provider, MD  ipratropium-albuterol (DUONEB) 0.5-2.5 (3) MG/3ML SOLN Take 3 mLs by nebulization every 4 (four) hours. 07/11/16  Yes Katharina Caper, MD  levofloxacin (LEVAQUIN) 750 MG tablet Take 1 tablet (750 mg total) by mouth daily. 07/11/16  Yes Katharina Caper, MD  metoprolol tartrate (LOPRESSOR) 25 MG tablet Take 1 tablet by mouth 2 (two) times daily. Dr Lady Gary 09/18/15  Yes Historical Provider, MD  predniSONE (DELTASONE) 5 MG tablet Take 5 mg by mouth daily as needed.   Yes Historical Provider, MD  predniSONE (STERAPRED UNI-PAK 21 TAB) 10 MG (21) TBPK tablet Take 1 tablet (10 mg total) by mouth daily. Please take 6 pills in the morning on the day one, then taper by 1 pill daily until finished, thank you 07/11/16  Yes Katharina Caper, MD  triamcinolone cream (KENALOG) 0.1 % APPLY TO FACIAL RASH 2 TIMES DAILY FOR 1 WEEK, THEN DAILY 01/20/16  Yes Duanne Limerick, MD    Physical Exam:    Vitals:   07/13/16 1315 07/13/16 1330 07/13/16 1400 07/13/16 1500  BP: 138/75 122/76 149/95 (!) 146/96  Pulse: 66 70 71 75  Resp: 16 17 16 16   Temp:    97.7 F (36.5 C)  TempSrc:    Oral  SpO2: 96% 97% 98% 98%  Weight:           Constitutional: NADanxious, ill appearing  Vitals:   07/13/16 1315 07/13/16 1330 07/13/16 1400 07/13/16 1500  BP: 138/75 122/76 149/95 (!) 146/96  Pulse: 66 70 71 75  Resp: 16 17 16 16   Temp:    97.7 F (36.5 C)  TempSrc:    Oral  SpO2: 96% 97% 98% 98%  Weight:       Eyes: PERRL, lids and conjunctivae normal ENMT: Mucous membranes are moist. Posterior pharynx clear of any exudate or lesions.  Neck: normal, supple, no masses, no thyromegaly Respiratory: clear to auscultation  bilaterally, no wheezing, no crackles. Normal respiratory effort. No accessory muscle use.  Cardiovascular: Regular rate and rhythm, no murmurs / rubs / gallops. No extremity edema. 2+ pedal pulses. No carotid bruits.  Abdomen: no tenderness, no masses palpated. No hepatosplenomegaly. Bowel sounds positive.  Musculoskeletal: no clubbing / cyanosis. No joint deformity upper and lower extremities. Good ROM, no contractures. Normal muscle tone.  Skin:bilateral groin candidiasis   Neurologic: confused. Cannot follow commands. Right  sided weakness  Dysarthric speech, receptive aphasia. Cannot perform further exam at this time  Psychiatric: Normal judgment and insight. Alert and oriented x 3. Normal mood.     Labs on Admission: I have personally reviewed following labs and imaging studies  CBC:  Recent Labs Lab 07/10/16 1157 07/11/16 0435 07/13/16 1047 07/13/16 1052  WBC 8.6 5.2 11.8*  --   NEUTROABS 5.9  --  8.4*  --   HGB 12.5* 13.0 12.5* 14.3  HCT 38.7* 40.1 39.8 42.0  MCV 94.3 93.1 94.1  --   PLT 153 154 188  --     Basic Metabolic Panel:  Recent Labs Lab 07/10/16 1637 07/11/16 0435 07/11/16 1224 07/13/16 1047 07/13/16 1052  NA 138 138 137 140 139  K 4.9 4.2 3.8 4.2 4.2  CL 105 101 100* 102 101  CO2 28 30 28 30   --   GLUCOSE 92 206* 129* 106* 104*  BUN 15 23* 26* 21* 24*  CREATININE 1.06 1.36* 1.39* 1.24 1.20  CALCIUM 8.7* 8.9 8.8* 9.2  --     GFR: Estimated Creatinine Clearance: 45 mL/min (by C-G formula based on SCr of 1.2 mg/dL).  Liver Function Tests:  Recent Labs Lab 07/13/16 1047  AST 23  ALT 15*  ALKPHOS 46  BILITOT 0.8  PROT 6.6  ALBUMIN 3.5   No results for input(s): LIPASE, AMYLASE in the last 168 hours. No results for input(s): AMMONIA in the last 168 hours.  Coagulation Profile:  Recent Labs Lab 07/10/16 1157 07/13/16 1047  INR 0.98 1.00    Cardiac Enzymes:  Recent Labs Lab 07/10/16 1157  TROPONINI <0.03    BNP (last 3  results) No results for input(s): PROBNP in the last 8760 hours.  HbA1C:  Recent Labs  07/10/16 1637  HGBA1C 5.9*    CBG:  Recent Labs Lab 07/13/16 1047  GLUCAP 106*    Lipid Profile:  Recent Labs  07/11/16 1224  CHOL 160  HDL 49  LDLCALC 91  TRIG 99  CHOLHDL 3.3    Thyroid Function Tests:  Recent Labs  07/10/16 1637  TSH 1.961    Anemia Panel: No results for input(s): VITAMINB12, FOLATE, FERRITIN, TIBC, IRON, RETICCTPCT in the last 72 hours.  Urine analysis:    Component Value Date/Time   COLORURINE YELLOW (A) 07/10/2016 1159   APPEARANCEUR HAZY (A) 07/10/2016 1159   LABSPEC 1.018 07/10/2016 1159   PHURINE 6.0 07/10/2016 1159   GLUCOSEU NEGATIVE 07/10/2016 1159   HGBUR NEGATIVE 07/10/2016 1159   BILIRUBINUR NEGATIVE 07/10/2016 1159   KETONESUR NEGATIVE 07/10/2016 1159   PROTEINUR 100 (A) 07/10/2016 1159   NITRITE NEGATIVE 07/10/2016 1159   LEUKOCYTESUR LARGE (A) 07/10/2016 1159    Sepsis Labs: @LABRCNTIP (procalcitonin:4,lacticidven:4) ) Recent Results (from the past 240 hour(s))  Urine culture     Status: None   Collection Time: 07/10/16 11:57 AM  Result Value Ref Range Status   Specimen Description URINE, RANDOM  Final   Special Requests NONE  Final   Culture NO GROWTH Performed at [facility]   Final   Report Status 07/11/2016 FINAL  Final     Radiological Exams on Admission: Dg Chest 2 View  Result Date: 07/13/2016 CLINICAL DATA:  Mental status changes. Respiratory distress. History of aortic valve replacement, previous CVA, coronary artery disease, former smoker, COPD. EXAM: CHEST  2 VIEW COMPARISON:  Portable chest x-ray of July 10, 2016 FINDINGS: The lungs are adequately inflated. There is no focal infiltrate. There is no  pleural effusion. The patient has undergone previous CABG and aortic valve replacement. There is calcification in the wall of the thoracic aorta. The mediastinum is normal in width. The bony thorax  exhibits no acute abnormality. IMPRESSION: There is no active cardiopulmonary disease. Previous CABG and aortic valve replacement. Thoracic aortic atherosclerosis. Electronically Signed   By: David  SwazilandJordan M.D.   On: 07/13/2016 14:32   Ct Head Code Stroke W/o Cm  Result Date: 07/13/2016 CLINICAL DATA:  Code stroke. 82 year old male with right side weakness and aphasia. However, known left ICA occlusion and abnormal left MCA with some left MCA territory infarcts since 07/10/2016 [facility] CTA, MRI, and MRA studies. EXAM: CT HEAD WITHOUT CONTRAST TECHNIQUE: Contiguous axial images were obtained from the base of the skull through the vertex without intravenous contrast. COMPARISON:  Noncontrast head CT, Brain MRI and intracranial MRA 07/10/2016, followed by CTA neck 07/10/2016 FINDINGS: Brain: Stable appearance of left temporal lobe and posterior left insula cortically based infarct which was restricted on diffusion 07/10/2016. New 2 cm area of cytotoxic edema at the junction of the posterior left temporal and left occipital lobes on series 21, image 17 today. Of note, lacunar type infarct in the ventral left thalamus is slightly less hypodense today. Superimposed chronic right cerebellar PICA territory infarct. No other areas of acute cytotoxic edema. No acute intracranial hemorrhage identified. Stable ventricle size and configuration. No midline shift, mass effect, or evidence of intracranial mass lesion. Vascular: Hyperdense distal left MCA M1 and left MCA bifurcation, new from the presentation head CT on 07/10/2016. Abnormal left M1 on MRA that day. Superimposed Calcified atherosclerosis at the skull base. Skull: No acute osseous abnormality identified. Sinuses/Orbits: Left sphenoid sinus bubbly opacity is stable. Other Visualized paranasal sinuses and mastoids are stable and well pneumatized. Other: No acute orbit or scalp soft tissue finding. Small midline occipital scalp lipoma.  ASPECTS [facility]([place] Stroke Program Early CT Score) Total score (0-10 with 10 being normal): Not really applicable considering there is a mix of recent core infarct and new cytotoxic edema in the left MCA territory in the setting of left ICA occlusion for at least 3 days. IMPRESSION: 1. Suspect worsening left MCA flow/occlusion since the 07/10/2016 imaging studies. Core infarct in the anterior left temporal lobe appears stable from the MRI 3 days ago, but the left MCA bifurcation now is hyperdense and there is a new 2 cm area of cytotoxic edema in the posterior left temporal/left occipital confluence. 2. No associated hemorrhage or intracranial mass effect. 3. ASPECTS is not really applicable in this clinical setting, and the constellation of findings was discussed by telephone with Dr. Ritta SlotMcNeill Kirkpatrick on 07/13/2016 at 1058 hours. Electronically Signed   By: Odessa FlemingH  Hall M.D.   On: 07/13/2016 11:13    EKG: Independently reviewed.  Assessment/Plan Active Problems:   Atherosclerosis of coronary artery of native heart without angina pectoris   Essential hypertension   Hyperlipidemia   Carotid stenosis   COPD exacerbation (HCC)   Stroke (cerebrum) (HCC)   Recurrent CVA with right sided weakness, expressive and receptive aphasia likely due to 100  LICA occlusion, not a candidate for surgical repair de to age and comorbidities  Not a TPA candidate  CT head shows likely progression of his stroke . Family is interested in further discussing with Palliative care the goals of care. He is DNR. Will not repeat recent testing. Neuro is involved    Admit to Tele obs  PT/OT/SLP Anticoagulation  Social Work  for possible placement once discharged from hospital, and after discussion with Palliative care team    Hypertension BP (!) 146/96  Pulse 75    Controlled Continue home anti-hypertensive medications tomorrow   Hyperlipidemia Continue home statins  COPD without exacerbation Osats normal on RA   CXR NAD   WBC   11.8 (has been on  Small dose Deltasone since last hospitalization due to COPD exacerbation while there )   Continue nebs and O2 prn   Continue small dose deltasone  Duonebs if exacerbation occurs   CAD,  EKG without ACS, at SR ,  Troponin neg  Patient appears  cardiac pain free at this time. Last 2 D echo 07/2016 showed    normal. Systolic function was normal.EF  55% to 65% Continue ASA, Plavix high dose statin, beta blockers (tomorrow)     DVT prophylaxis: Lovenox   Code Status:   DNR Family Communication:  Discussed with patient Disposition Plan: Expect patient to be discharged to home versus long term placement pending on Pallcare discussion  Consults called:    None Admission status:Tele  Obs      Coraline Talwar E, PA-C [facility]   07/13/2016, 3:54 PM

## 2016-07-14 DIAGNOSIS — R4701 Aphasia: Secondary | ICD-10-CM

## 2016-07-14 DIAGNOSIS — I63412 Cerebral infarction due to embolism of left middle cerebral artery: Principal | ICD-10-CM

## 2016-07-14 DIAGNOSIS — Z66 Do not resuscitate: Secondary | ICD-10-CM

## 2016-07-14 DIAGNOSIS — R1310 Dysphagia, unspecified: Secondary | ICD-10-CM

## 2016-07-14 DIAGNOSIS — Z515 Encounter for palliative care: Secondary | ICD-10-CM

## 2016-07-14 MED ORDER — HALOPERIDOL LACTATE 5 MG/ML IJ SOLN
2.0000 mg | INTRAMUSCULAR | Status: DC | PRN
  Administered 2016-07-14: 2 mg via INTRAVENOUS
  Filled 2016-07-14: qty 1

## 2016-07-14 MED ORDER — MORPHINE SULFATE (CONCENTRATE) 10 MG/0.5ML PO SOLN: 5.0000 mg | ORAL | Status: DC | PRN

## 2016-07-14 NOTE — Evaluation (Addendum)
Clinical/Bedside Swallow Evaluation Patient Details  Name: Dakota [name] MRN: [id number] Date of Birth: [birth year]  Today's Date: 07/14/2016 Time: SLP Start Time (ACUTE ONLY): 0844 SLP Stop Time (ACUTE ONLY): 0856 SLP Time Calculation (min) (ACUTE ONLY): 12 min  Past Medical History:  Past Medical History:  Diagnosis Date  . Anemia   . Aortic stenosis   . CAD (coronary artery disease)    s/p CABG  . COPD (chronic obstructive pulmonary disease) (HCC)   . Gout   . H/O aortic valve replacement   . Hyperlipidemia   . Hypertension   . Rheumatoid arthritis (HCC)   . Stroke [facility])    Past Surgical History:  Past Surgical History:  Procedure Laterality Date  . AORTIC VALVE REPLACEMENT (AVR)/CORONARY ARTERY BYPASS GRAFTING (CABG)    . CARDIAC VALVE REPLACEMENT    . CORONARY ARTERY BYPASS GRAFT    . EYE SURGERY     cataract  . forearm tendon surgery Right   . HERNIA REPAIR    . TONSILLECTOMY     HPI:  Patient is an 82 yo male admitted 07/13/16 with Rt-sided weakness, aphasia, Lt gaze preference. Recent admission 12/5-12-7/17 acute/subacute infarct involving the left superior temporal gyrus and sylvian fissure, acute/subacute non hemorrhagic infarct within the medial anterior   Assessment / Plan / Recommendation Clinical Impression  Pt demonstrates fluent aphasia with poor comprehension; difficulty following commands. Suspect aspiration with transient awareness indicated by delayed coughs several times throughout evaluation. Subtle signs present including eyes becoming red and watery. Functional mastication of solid texture. SLP explained results of bedside assessment with wife at bedside and Palliative care NP, Corrie Dandy also present. Options discussed and explained including MBS, thickened liquids, comfort feeds preventing dehydration and quality of life. Wife stated she would like to accept aspiration risks with regular diet texture/thin liquids which SLP concurs should be to his benefit.  Palliative is meeting with wife and daughter this afternoon. Wife will need reiteration of conversation and comfort feeds. If however pt begins to cough frequently negating pleasure and comfort of thin liquids, may suggest thick liquids at that time. ST will follow for tolerance.          Aspiration Risk  Severe aspiration risk    Diet Recommendation Regular;Thin liquid   Liquid Administration via: Cup (cup preferred) Medication Administration: Crushed with puree Supervision: Full supervision/cueing for compensatory strategies;Patient able to self feed Compensations: Minimize environmental distractions;Slow rate;Small sips/bites Postural Changes: Seated upright at 90 degrees    Other  Recommendations Oral Care Recommendations: Oral care BID   Follow up Recommendations None      Frequency and Duration min 2x/week  2 weeks       Prognosis Prognosis for Safe Diet Advancement:  (n/a) Barriers to Reach Goals: Cognitive deficits      Swallow Study   General HPI: Patient is an 82 yo male admitted 07/13/16 with Rt-sided weakness, aphasia, Lt gaze preference. Recent admission 12/5-12-7/17 acute/subacute infarct involving the left superior temporal gyrus and sylvian fissure, acute/subacute non hemorrhagic infarct within the medial anterior Type of Study: Bedside Swallow Evaluation Previous Swallow Assessment: none found Diet Prior to this Study: NPO Temperature Spikes Noted: No Respiratory Status: Room air History of Recent Intubation: No Behavior/Cognition: Alert;Requires cueing;Distractible;Cooperative Oral Cavity Assessment: Dry Oral Care Completed by SLP: Yes Oral Cavity - Dentition: Adequate natural dentition Vision: Functional for self-feeding Self-Feeding Abilities: Able to feed self;Needs assist;Needs set up Patient Positioning: Upright in bed Baseline Vocal Quality: Normal Volitional Cough: Cognitively unable to elicit  Volitional Swallow: Unable to elicit     Oral/Motor/Sensory Function Overall Oral Motor/Sensory Function:  (no focal weakness-pt unable to follow commands)   Ice Chips Ice chips: Impaired Presentation: Spoon Pharyngeal Phase Impairments: Suspected delayed Swallow   Thin Liquid Thin Liquid: Impaired Presentation: Spoon;Cup Pharyngeal  Phase Impairments: Suspected delayed Swallow;Cough - Delayed (eyes became red and watery)    Nectar Thick Nectar Thick Liquid: Impaired Presentation: Cup Pharyngeal Phase Impairments: Suspected delayed Swallow   Honey Thick Honey Thick Liquid: Not tested   Puree Puree: Impaired Pharyngeal Phase Impairments: Suspected delayed Swallow   Solid   GO   Solid: Impaired Pharyngeal Phase Impairments: Suspected delayed Swallow        [name] 07/14/2016,9:48 AM   Dakota [name].Ed [employer] [phone]

## 2016-07-14 NOTE — Consult Note (Signed)
Consultation Note Date: 07/14/2016   Patient Name: Dakota [name]  DOB: Feb 06, 1926  MRN: [id number]  Age / Sex: 82 y.o., male  PCP: Duanne Limerick, MD Referring Physician: Jordan Hawks*  Reason for Consultation: Establishing goals of care, Non pain symptom management, Pain control and Psychosocial/spiritual support  HPI/Patient Profile: 82 y.o. male  admitted on 07/13/2016 with history of  with recent admission for stroke from December 5, discharged on December 9, after presenting with new right sided weakness, and speech difficulty, in the setting of 100 % L  carotid artery occlusion.   He underwent extensive workup at the hospital and his symptoms had improved upon discharge.  At the time, he was not deemed to be a surgical candidate due to age  and multiple commorbidities.   He presented to ER with similar symptoms including receptive and expressive aphasia, and right sided weakness. He appeared confused today.  He was having difficulty swallowing as well.  CT of the head ( 07-13-16) Suspect worsening left MCA flow/occlusion since the 07/10/2016 imaging studies Core infarct in the anterior left temporal lobe appears stable from the MRI 3 days ago, but the left MCA bifurcation now is hyperdense and there is a new 2 cm area of cytotoxic edema in the posterior left temporal/left occipital confluence. 2. No associated hemorrhage or intracranial mass effect  Family faces advanced directive decisions and anticipatory care needs   Clinical Assessment and Goals of Care:  This NP Lorinda Creed reviewed medical records, received report from team, assessed the patient and then meet at the patient's bedside along with his wife and daughter/ Clydie Braun  to discuss diagnosis, prognosis, GOC, EOL wishes disposition and options.  A detailed discussion was had today regarding advanced directives.  Concepts specific  to code status, artifical feeding and hydration, continued IV antibiotics and rehospitalization was had.  The difference between a aggressive medical intervention path  and a palliative comfort care path for this patient at this time was had.  Values and goals of care important to patient and family were attempted to be elicited.  Hard Choices left for review  Concept of Hospice and Palliative Care were discussed  Natural trajectory and expectations at EOL were discussed.  Questions and concerns addressed.   Family encouraged to call with questions or concerns.  PMT will continue to support holistically.   HCPOA/wife    SUMMARY OF RECOMMENDATIONS    -focus of care is comfort, quality and dignity  Code Status/Advance Care Planning:  DNR  Symptom Management:   Pain/Dyspnea: Roxanol 5 mg po/sl every 1 hr prn  Agitation: Haldol 2 mg IV every 3 hrs prn  Palliative Prophylaxis:   Aspiration, Bowel Regimen, Delirium Protocol, Frequent Pain Assessment and Oral Care  Additional Recommendations (Limitations, Scope, Preferences):  Full Comfort Care, No Artificial Feeding, No Blood Transfusions, No Diagnostics, No Glucose Monitoring, No IV Antibiotics, No IV Fluids and No Lab Draws  Psycho-social/Spiritual:   Desire for further Chaplaincy support:yes  Additional Recommendations: Education on Hospice and Grief/Bereavement Support  Prognosis:   Will evaluate in the morning to determine hospice facility vs SNF with hospice.  High risk for decompensation; full comfort, poor po intake with aspiration risk, no antibiotic use, no fluid resuscitation.  Discharge Planning:  To be determined     Primary Diagnoses: Present on Admission: . COPD exacerbation (HCC) . Carotid stenosis . Hyperlipidemia . Essential hypertension . Atherosclerosis of coronary artery of native heart without angina pectoris . Stroke (cerebrum) (HCC)   I have reviewed the medical record, interviewed the  patient and family, and examined the patient. The following aspects are pertinent.  Past Medical History:  Diagnosis Date  . Anemia   . Aortic stenosis   . CAD (coronary artery disease)    s/p CABG  . COPD (chronic obstructive pulmonary disease) (HCC)   . Gout   . H/O aortic valve replacement   . Hyperlipidemia   . Hypertension   . Rheumatoid arthritis (HCC)   . Stroke [facility])    Social History   Social History  . Marital status: Single    Spouse name: N/A  . Number of children: N/A  . Years of education: N/A   Social History Main Topics  . Smoking status: Former Games developer  . Smokeless tobacco: Never Used     Comment: Quit about 25 hyears ago  . Alcohol use No  . Drug use: No  . Sexual activity: Not Asked   Other Topics Concern  . None   Social History Narrative  . None   Family History  Problem Relation Age of Onset  . Congestive Heart Failure Father    Scheduled Meds: . allopurinol  300 mg Oral BID  . aspirin EC  81 mg Oral Daily  . atorvastatin  40 mg Oral q1800  . chlorhexidine  15 mL Mouth Rinse BID  . clopidogrel  75 mg Oral Daily  . enoxaparin (LOVENOX) injection  40 mg Subcutaneous Q24H  . hydroxychloroquine  400 mg Oral Daily  . ipratropium-albuterol  3 mL Nebulization Q4H  . mouth rinse  15 mL Mouth Rinse q12n4p   Continuous Infusions: . sodium chloride 100 mL/hr at 07/14/16 0430   PRN Meds:.acetaminophen **OR** acetaminophen (TYLENOL) oral liquid 160 mg/5 mL **OR** acetaminophen, senna-docusate Medications Prior to Admission:  Prior to Admission medications   Medication Sig Start Date End Date Taking? Authorizing Provider  albuterol (PROVENTIL HFA;VENTOLIN HFA) 108 (90 Base) MCG/ACT inhaler Inhale 2 puffs into the lungs every 6 (six) hours as needed for wheezing or shortness of breath. 05/01/16  Yes Duanne Limerick, MD  allopurinol (ZYLOPRIM) 300 MG tablet Take 1 tablet by mouth 2 (two) times daily. Dr Lavenia Atlas 09/18/15  Yes Historical Provider,  MD  aspirin EC 81 MG tablet Take 81 mg by mouth daily.   Yes Historical Provider, MD  atorvastatin (LIPITOR) 40 MG tablet Take 1 tablet (40 mg total) by mouth daily at 6 PM. 07/11/16  Yes Katharina Caper, MD  clopidogrel (PLAVIX) 75 MG tablet Take 1 tablet (75 mg total) by mouth daily. 07/11/16  Yes Katharina Caper, MD  furosemide (LASIX) 20 MG tablet Take 1 tablet by mouth as needed. Dr Lady Gary 05/15/14  Yes Historical Provider, MD  hydroxychloroquine (PLAQUENIL) 200 MG tablet Take 2 tablets by mouth daily. Dr Lavenia Atlas 09/18/15  Yes Historical Provider, MD  ipratropium-albuterol (DUONEB) 0.5-2.5 (3) MG/3ML SOLN Take 3 mLs by nebulization every 4 (four) hours. 07/11/16  Yes Katharina Caper, MD  levofloxacin (LEVAQUIN) 750 MG tablet Take 1 tablet (750  mg total) by mouth daily. 07/11/16  Yes Katharina Caper, MD  metoprolol tartrate (LOPRESSOR) 25 MG tablet Take 1 tablet by mouth 2 (two) times daily. Dr Lady Gary 09/18/15  Yes Historical Provider, MD  predniSONE (DELTASONE) 5 MG tablet Take 5 mg by mouth daily as needed.   Yes Historical Provider, MD  predniSONE (STERAPRED UNI-PAK 21 TAB) 10 MG (21) TBPK tablet Take 1 tablet (10 mg total) by mouth daily. Please take 6 pills in the morning on the day one, then taper by 1 pill daily until finished, thank you 07/11/16  Yes Katharina Caper, MD  triamcinolone cream (KENALOG) 0.1 % APPLY TO FACIAL RASH 2 TIMES DAILY FOR 1 WEEK, THEN DAILY 01/20/16  Yes Duanne Limerick, MD   Allergies  Allergen Reactions  . Colchicine Shortness Of Breath  . Penicillin G Rash   Review of Systems  Unable to perform ROS: Mental status change    Physical Exam  Constitutional: He appears well-developed.  Cardiovascular: Normal rate and regular rhythm.   Pulmonary/Chest:  -audible throat secretions  Abdominal: Soft.  Skin: Skin is warm and dry.    Vital Signs: BP (!) 141/47 (BP Location: Left Arm)   Pulse 89   Temp 98.1 F (36.7 C) (Oral)   Resp 20   Ht 5\' 8"  (1.727 m)   Wt 84.3 kg  (185 lb 13.6 oz)   SpO2 92%   BMI 28.26 kg/m  Pain Assessment: No/denies pain       SpO2: SpO2: 92 % O2 Device:SpO2: 92 % O2 Flow Rate: .   IO: Intake/output summary:  Intake/Output Summary (Last 24 hours) at 07/14/16 1000 Last data filed at 07/14/16 0547  Gross per 24 hour  Intake          1011.67 ml  Output              300 ml  Net           711.67 ml    LBM: Last BM Date: 07/12/16 Baseline Weight: Weight: 84.3 kg (185 lb 13.6 oz) Most recent weight: Weight: 84.3 kg (185 lb 13.6 oz)      Palliative Assessment/Data:   Flowsheet Rows   Flowsheet Row Most Recent Value  Intake Tab  Referral Department  Hospitalist  Unit at Time of Referral  ER  Palliative Care Primary Diagnosis  Neurology  Date Notified  07/13/16  Palliative Care Type  New Palliative care  Reason for referral  Clarify Goals of Care  Date of Admission  07/13/16  # of days IP prior to Palliative referral  0  Clinical Assessment  Psychosocial & Spiritual Assessment  Palliative Care Outcomes     Discussed with Dr 14/11/17  Time In: 0800 Time Out: 0915 Time Total: 75 min Greater than 50%  of this time was spent counseling and coordinating care related to the above assessment and plan.  Signed by: 0916, NP   Please contact Palliative Medicine Team phone at [phone] for questions and concerns.  For individual provider: See [phone]

## 2016-07-14 NOTE — Evaluation (Signed)
Occupational Therapy Evaluation Patient Details Name: Dakota [name] MRN: [id number] DOB: 13-Sep-1925 Today's Date: 07/14/2016    History of Present Illness Patient is an 82 yo male admitted 07/13/16 with Rt-sided weakness, aphasia, Lt gaze preference.  CT showed patient had extension of prior CVA with involvement of Lt parieto-occipital region.    PMH:  recent admit 07/07/16-07/11/16 to [facility] with CVA,  COPD, anemia, AS s/p AVR, CAD, CABG, HLD, HTN, RA   Clinical Impression   Pt was had occasional assist for LB dressing and ambulated without a device prior to admission. Per wife, pt likes to make jokes. Pt presents with global aphasia, poor activity tolerance, decreased standing balance and poor safety awareness. He requires min to max assist for ADL. Recommending SNF for further rehab.     Follow Up Recommendations  SNF;Supervision/Assistance - 24 hour    Equipment Recommendations       Recommendations for Other Services       Precautions / Restrictions Precautions Precautions: Fall Restrictions Weight Bearing Restrictions: No      Mobility Bed Mobility Overal bed mobility: Needs Assistance Bed Mobility: Supine to Sit;Sit to Supine     Supine to sit: Max assist Sit to supine: Min assist   General bed mobility comments: multimodal cues to sit EOB, assist to raise trunk and increased time to scoot to EOB, guided LEs back into bed  Transfers Overall transfer level: Needs assistance Equipment used: Rolling walker (2 wheeled) Transfers: Sit to/from [employer] Sit to Stand: Min assist Stand pivot transfers: Min assist       General transfer comment: assist to rise and gain balance    Balance Overall balance assessment: Needs assistance   Sitting balance-Leahy Scale: Fair       Standing balance-Leahy Scale: Poor                              ADL Overall ADL's : Needs assistance/impaired Eating/Feeding: Minimal  assistance;Sitting Eating/Feeding Details (indicate cue type and reason): resistant to eating, but scooped one spoonful to mouth Grooming: Wash/dry face;Minimal assistance;Sitting Grooming Details (indicate cue type and reason): decreased thoroughness  Upper Body Bathing: Maximal assistance;Sitting   Lower Body Bathing: Sit to/from stand;Maximal assistance   Upper Body Dressing : Maximal assistance;Sitting;Bed level Upper Body Dressing Details (indicate cue type and reason): changed gown x 2 due to urinary incontinence Lower Body Dressing: Maximal assistance;Sit to/from stand   Toilet Transfer: Minimal assistance;Stand-pivot;BSC;RW   Toileting- Clothing Manipulation and Hygiene: Total assistance         General ADL Comments: Pt sat EOB x 10 minutes, stood x 1 to demonstrate ability and to use urinal.      Vision     Perception     Praxis      Pertinent Vitals/Pain Pain Assessment: Faces Pain Score: 0-No pain Faces Pain Scale: No hurt Pain Intervention(s): Monitored during session     Hand Dominance Right   Extremity/Trunk Assessment Upper Extremity Assessment Upper Extremity Assessment: RUE deficits/detail RUE Deficits / Details: 4/5, slower than L RUE Coordination: decreased fine motor;decreased gross motor   Lower Extremity Assessment Lower Extremity Assessment: Defer to PT evaluation       Communication Communication Communication: Expressive difficulties;Receptive difficulties;HOH   Cognition Arousal/Alertness: Awake/alert Behavior During Therapy: Flat affect;Impulsive Overall Cognitive Status: Impaired/Different from baseline Area of Impairment: Following commands;Safety/judgement       Following Commands: Follows one step commands inconsistently;Follows one step commands  with increased time (and multimodal cues) Safety/Judgement: Decreased awareness of safety;Decreased awareness of deficits     General Comments: follows commands within activity  context   General Comments       Exercises       Shoulder Instructions      Home Living Family/patient expects to be discharged to:: Private residence Living Arrangements: Spouse/significant other Available Help at Discharge: Family Type of Home: Mobile home Home Access: Stairs to enter Entrance Stairs-Number of Steps: 4 Entrance Stairs-Rails: Right;Left Home Layout: One level     Bathroom Shower/Tub: Producer, television/film/video: Standard     Home Equipment: Environmental consultant - 2 wheels;Cane - single point;Transport chair;Shower seat      Lives With: Spouse    Prior Functioning/Environment Level of Independence: Needs assistance  Gait / Transfers Assistance Needed: Limited community distances without AD ADL's / Homemaking Assistance Needed: some assistance with getting pants donned  Communication / Swallowing Assistance Needed: very HOH Comments: Information on PLOF from prior to CVA on 07/07/16.          OT Problem List: Decreased strength;Decreased activity tolerance;Impaired balance (sitting and/or standing);Decreased coordination;Decreased cognition;Decreased knowledge of use of DME or AE;Decreased safety awareness   OT Treatment/Interventions: Self-care/ADL training;DME and/or AE instruction;Therapeutic activities;Cognitive remediation/compensation;Patient/family education;Balance training    OT Goals(Current goals can be found in the care plan section) Acute Rehab OT Goals Patient Stated Goal: Unable to state OT Goal Formulation: With family Time For Goal Achievement: 07/28/16 Potential to Achieve Goals: Good ADL Goals Pt Will Perform Grooming: with min assist;standing Pt Will Perform Upper Body Bathing: with min assist;sitting Pt Will Perform Upper Body Dressing: sitting;with min assist Pt Will Transfer to Toilet: with min assist;ambulating Pt Will Perform Toileting - Clothing Manipulation and hygiene: with mod assist;sit to/from stand Additional ADL Goal #1:  Pt will perform bed mobility with min assist.  OT Frequency: Min 3X/week   Barriers to D/C:            Co-evaluation              End of Session Equipment Utilized During Treatment: Gait belt;Rolling walker Nurse Communication: Mobility status  Activity Tolerance: Patient tolerated treatment well Patient left: in bed;with call bell/phone within reach;with bed alarm set;with family/visitor present   Time: 1100-1135 OT Time Calculation (min): 35 min Charges:  OT General Charges $OT Visit: 1 Procedure OT Evaluation $OT Eval Moderate Complexity: 1 Procedure OT Treatments $Self Care/Home Management : 8-22 mins G-Codes:    Evern Bio 07/14/2016, 12:58 PM  [phone]

## 2016-07-14 NOTE — Clinical Social Work Note (Signed)
CSW consulted for placement. CSW spoke with Palliative Care NP, Lorinda Creed. Pt is now comfort care, and pt will be evaluated in morning to determine appropriate placement. Per NP, family is interested in [facility]. CSW will continue to follow.   Dede Query, MSW, LCSW  Clinical Social Worker  [phone]

## 2016-07-14 NOTE — Progress Notes (Signed)
Occupational Therapy Treatment Patient Details Name: Dakota [name] MRN: [id number] DOB: 12-05-1925 Today's Date: 07/14/2016    History of present illness Patient is an 82 yo male admitted 07/13/16 with Rt-sided weakness, aphasia, Lt gaze preference.  CT showed patient had extension of prior CVA with involvement of Lt parieto-occipital region.    PMH:  recent admit 07/07/16-07/11/16 to [facility] with CVA,  COPD, anemia, AS s/p AVR, CAD, CABG, HLD, HTN, RA   OT comments  Assisted pt and family with pt using urinal and change of bed linens. Pt winking at therapist at end of session. Daughter tearful during session as she spoke about his prognosis, but committed to her dad being comfortable.  Follow Up Recommendations  SNF;Supervision/Assistance - 24 hour    Equipment Recommendations       Recommendations for Other Services      Precautions / Restrictions Precautions Precautions: Fall Restrictions Weight Bearing Restrictions: No       Mobility   Balance    ADL Overall ADL's : Needs assistance/impaired   Grooming: Oral care;Maximal assistance;Bed level Grooming Details (indicate cue type and reason): oral care with toothette    Toileting- Clothing Manipulation and Hygiene: Total assistance;Sit to/from stand         General ADL Comments: Wife asking for assist after pt indicated he needed to use the urinal. Pt with urinary incontinence. Assisted to change linen and clean pt.      Vision                     Perception     Praxis      Cognition   Behavior During Therapy: Flat affect;Impulsive Overall Cognitive Status: Impaired/Different from baseline Area of Impairment: Following commands;Safety/judgement        Following Commands: Follows one step commands inconsistently;Follows one step commands with increased time (with multimodal cues) Safety/Judgement: Decreased awareness of safety;Decreased awareness of deficits     General Comments: follows commands  within activity context    Extremity/Trunk Assessment  Upper Extremity Assessment Upper Extremity Assessment: RUE deficits/detail RUE Deficits / Details: 4/5, slower than L RUE Coordination: decreased fine motor;decreased gross motor   Lower Extremity Assessment Lower Extremity Assessment: Defer to PT evaluation        Exercises     Shoulder Instructions       General Comments      Pertinent Vitals/ Pain       Pain Assessment: Faces Faces Pain Scale: No hurt  Home Living Family/patient expects to be discharged to:: Private residence Living Arrangements: Spouse/significant other Available Help at Discharge: Family Type of Home: Mobile home Home Access: Stairs to enter Entrance Stairs-Number of Steps: 4 Entrance Stairs-Rails: Right;Left Home Layout: One level     Bathroom Shower/Tub: Producer, television/film/video: Standard     Home Equipment: Environmental consultant - 2 wheels;Cane - single point;Transport chair;Shower seat      Lives With: Spouse    Prior Functioning/Environment Level of Independence: Needs assistance  Gait / Transfers Assistance Needed: Limited community distances without AD ADL's / Homemaking Assistance Needed: some assistance with getting pants donned  Communication / Swallowing Assistance Needed: very HOH Comments: Information on PLOF from prior to CVA on 07/07/16.     Frequency  Min 2X/week        Progress Toward Goals  OT Goals(current goals can now be found in the care plan section)     Acute Rehab OT Goals Patient Stated Goal: Unable to state OT  Goal Formulation: With family Time For Goal Achievement: 07/28/16 Potential to Achieve Goals: Fair ADL Goals Pt Will Perform Grooming: with min assist;standing Pt Will Perform Upper Body Bathing: with min assist;sitting Pt Will Perform Upper Body Dressing: sitting;with min assist Pt Will Transfer to Toilet: with min assist;ambulating Pt Will Perform Toileting - Clothing Manipulation and  hygiene: with mod assist;sit to/from stand Additional ADL Goal #1: Pt will perform bed mobility with min assist.  Plan Discharge plan remains appropriate    Co-evaluation                 End of Session Equipment Utilized During Treatment: Gait belt;Rolling walker   Activity Tolerance Patient tolerated treatment well   Patient Left in bed;with call bell/phone within reach;with bed alarm set;with family/visitor present   Nurse Communication Mobility status        Time: 1445-1500 OT Time Calculation (min): 15 min  Charges: OT General Charges $OT Visit: 1 Procedure OT Evaluation $OT Eval Moderate Complexity: 1 Procedure OT Treatments $Self Care/Home Management : 8-22 mins  Evern Bio 07/14/2016, 3:10 PM

## 2016-07-14 NOTE — Progress Notes (Addendum)
PROGRESS NOTE    Dakota [name]  GUR:[id number] DOB: [birth year] DOA: 07/13/2016 PCP: Elizabeth Sauer, MD     Brief Narrative:  Dakota [name] is a 82 y.o. male with recent admission for stroke from December 5, discharged on December 9, after presenting with new right sided weakness, and speech difficulty, in the setting of 100 % L  carotid artery occlusion. He underwent extensive workup at the hospital and his symptoms had improved upon discharge. At the time, he was not deemed to be a surgical candidate due to age  and multiple commorbidities. He presented to [facility] on 07/13/16 with similar symptoms including receptive and expressive aphasia, right sided weakness, confusion.    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 Not a TPA candidate. CT head shows likely progression of his stroke . Family is interested in further discussing with Palliative care the goals of care -Continue aspirin, plavix -PT/OT/SLP -Palliative care consulted, family meeting planned for this afternoon   Hypertension  -Controlled. Hold metoprolol to allow permission HTN in setting of carotid occlusion and stroke   Hyperlipidemia -Continue home statins  COPD, not-acute exacerbation  -Duonebs   CAD -Continue ASA, Plavix, statin  RA -Continue plaquenil   DVT prophylaxis: lovenox Code Status: DNR Family Communication: wife at bedside Disposition Plan: pending family meeting later this afternoon      Consultants:   Neurology  Palliative care  Procedures:   None  Antimicrobials:   None     Subjective: Unable to answer questions. Remains aphasic   Objective: Vitals:   07/14/16 0100 07/14/16 0300 07/14/16 0409 07/14/16 0546  BP: (!) 133/54 (!) 146/59  (!) 147/64  Pulse: 86 88 85 92  Resp: 18 20 18 19   Temp:    97.6 F (36.4 C)  TempSrc:    Oral  SpO2: 95% 96% 98% 95%  Weight:      Height:        Intake/Output Summary (Last 24 hours) at 07/14/16 0736 Last data filed at 07/14/16 0547  Gross per 24 hour  Intake          1011.67 ml  Output              300 ml  Net           711.67 ml   Filed Weights   07/13/16 1104 07/13/16 2100  Weight: 84.3 kg (185 lb 13.6 oz) 84.3 kg (185 lb 13.6 oz)    Examination:  General exam: Appears calm and comfortable  Respiratory system: Upper respiratory rhonchi with cough. Respiratory effort normal. Cardiovascular system: S1 & S2 heard, RRR. No JVD, murmurs, rubs, gallops or clicks. No pedal edema. Gastrointestinal system: Abdomen is nondistended, soft and nontender. No organomegaly or masses felt. Normal bowel sounds heard. Central nervous system: Alert, expressive and receptive aphasia, no focal weakness  Extremities: Symmetric, moves all extremities spontaneously  Skin: No rashes, lesions or ulcers on exposed skin   Data Reviewed: I have personally reviewed following labs and imaging studies  CBC:  Recent Labs Lab 07/10/16 1157 07/11/16 0435 07/13/16 1047 07/13/16 1052  WBC 8.6 5.2 11.8*  --   NEUTROABS 5.9  --  8.4*  --   HGB 12.5* 13.0 12.5* 14.3  HCT 38.7* 40.1 39.8 42.0  MCV 94.3 93.1 94.1  --   PLT 153 154 188  --    Basic Metabolic Panel:  Recent Labs Lab 07/10/16 1637 07/11/16 0435 07/11/16 1224 07/13/16 1047 07/13/16 1052  NA 138 138 137 140 139  K 4.9 4.2 3.8 4.2 4.2  CL 105 101 100* 102 101  CO2 28 30 28 30   --   GLUCOSE 92 206* 129* 106* 104*  BUN 15 23* 26* 21* 24*  CREATININE 1.06  1.36* 1.39* 1.24 1.20  CALCIUM 8.7* 8.9 8.8* 9.2  --    GFR: Estimated Creatinine Clearance: 45 mL/min (by C-G formula based on SCr of 1.2 mg/dL). Liver Function Tests:  Recent Labs Lab 07/13/16 1047  AST 23  ALT 15*  ALKPHOS 46  BILITOT 0.8  PROT 6.6  ALBUMIN 3.5   No results for input(s): LIPASE, AMYLASE in the last 168 hours. No results for input(s): AMMONIA in the last 168 hours. Coagulation Profile:  Recent Labs Lab 07/10/16 1157 07/13/16 1047 07/13/16 1808  INR 0.98 1.00 1.06   Cardiac Enzymes:  Recent Labs Lab 07/10/16 1157 07/13/16 1637  TROPONINI <0.03 0.03*   BNP (last 3 results) No results for input(s): PROBNP in the last 8760 hours. HbA1C: No results for input(s): HGBA1C in the last 72 hours. CBG:  Recent Labs Lab 07/13/16 1047  GLUCAP 106*   Lipid Profile:  Recent Labs  07/11/16 1224  CHOL 160  HDL 49  LDLCALC 91  TRIG 99  CHOLHDL 3.3   Thyroid Function Tests: No results for input(s): TSH, T4TOTAL, FREET4, T3FREE, THYROIDAB in the last 72 hours. Anemia Panel: No results for input(s): VITAMINB12, FOLATE, FERRITIN, TIBC, IRON, RETICCTPCT in the last 72 hours. Sepsis Labs: No results for input(s): PROCALCITON, LATICACIDVEN in the last 168 hours.  Recent Results (from the past 240 hour(s))  Urine culture     Status: None   Collection Time: 07/10/16 11:57 AM  Result Value Ref Range Status   Specimen Description URINE, RANDOM  Final   Special Requests NONE  Final   Culture NO GROWTH Performed at [facility]   Final   Report Status 07/11/2016 FINAL  Final       Radiology Studies: Dg Chest 2 View  Result Date: 07/13/2016 CLINICAL DATA:  Mental status changes. Respiratory distress. History of aortic valve replacement, previous CVA, coronary artery disease, former smoker, COPD. EXAM: CHEST  2 VIEW COMPARISON:  Portable chest x-ray of July 10, 2016 FINDINGS: The lungs are adequately inflated. There is no focal infiltrate.  There is no pleural effusion. The patient has undergone previous CABG and aortic valve replacement. There is calcification in the wall of the thoracic aorta. The mediastinum is normal in width. The bony thorax exhibits no acute abnormality. IMPRESSION: There is no active cardiopulmonary disease. Previous CABG and aortic valve replacement. Thoracic aortic atherosclerosis. Electronically Signed   By: David  Swaziland M.D.   On: 07/13/2016 14:32   Ct Head Code Stroke W/o Cm  Result Date: 07/13/2016 CLINICAL DATA:  Code stroke. 82 year old male with right side weakness and aphasia. However, known left ICA occlusion and abnormal left MCA with some left MCA territory infarcts since 07/10/2016 [facility] CTA, MRI, and MRA studies. EXAM: CT  HEAD WITHOUT CONTRAST TECHNIQUE: Contiguous axial images were obtained from the base of the skull through the vertex without intravenous contrast. COMPARISON:  Noncontrast head CT, Brain MRI and intracranial MRA 07/10/2016, followed by CTA neck 07/10/2016 FINDINGS: Brain: Stable appearance of left temporal lobe and posterior left insula cortically based infarct which was restricted on diffusion 07/10/2016. New 2 cm area of cytotoxic edema at the junction of the posterior left temporal and left occipital lobes on series 21, image 17 today. Of note, lacunar type infarct in the ventral left thalamus is slightly less hypodense today. Superimposed chronic right cerebellar PICA territory infarct. No other areas of acute cytotoxic edema. No acute intracranial hemorrhage identified. Stable ventricle size and configuration. No midline shift, mass effect, or evidence of intracranial mass lesion. Vascular: Hyperdense distal left MCA M1 and left MCA bifurcation, new from the presentation head CT on 07/10/2016. Abnormal left M1 on MRA that day. Superimposed Calcified atherosclerosis at the skull base. Skull: No acute osseous abnormality identified. Sinuses/Orbits: Left  sphenoid sinus bubbly opacity is stable. Other Visualized paranasal sinuses and mastoids are stable and well pneumatized. Other: No acute orbit or scalp soft tissue finding. Small midline occipital scalp lipoma. ASPECTS [facility] Stroke Program Early CT Score) Total score (0-10 with 10 being normal): Not really applicable considering there is a mix of recent core infarct and new cytotoxic edema in the left MCA territory in the setting of left ICA occlusion for at least 3 days. IMPRESSION: 1. Suspect worsening left MCA flow/occlusion since the 07/10/2016 imaging studies. Core infarct in the anterior left temporal lobe appears stable from the MRI 3 days ago, but the left MCA bifurcation now is hyperdense and there is a new 2 cm area of cytotoxic edema in the posterior left temporal/left occipital confluence. 2. No associated hemorrhage or intracranial mass effect. 3. ASPECTS is not really applicable in this clinical setting, and the constellation of findings was discussed by telephone with Dr. Ritta Slot on 07/13/2016 at 1058 hours. Electronically Signed   By: Odessa Fleming M.D.   On: 07/13/2016 11:13      Scheduled Meds: . allopurinol  300 mg Oral BID  . aspirin EC  81 mg Oral Daily  . atorvastatin  40 mg Oral q1800  . chlorhexidine  15 mL Mouth Rinse BID  . clopidogrel  75 mg Oral Daily  . enoxaparin (LOVENOX) injection  40 mg Subcutaneous Q24H  . hydroxychloroquine  400 mg Oral Daily  . ipratropium-albuterol  3 mL Nebulization Q4H  . mouth rinse  15 mL Mouth Rinse q12n4p   Continuous Infusions: . sodium chloride 100 mL/hr at 07/14/16 0430     LOS: 1 day    Time spent: 40 minutes   Noralee Stain, DO [facility] [email or web address] Password [facility] 07/14/2016, 7:36 AM

## 2016-07-14 NOTE — Care Management Note (Signed)
Case Management Note  Patient Details  Name: Dakota [name] MRN: [id number] Date of Birth: 03-03-1926  Subjective/Objective:   Pt admitted with extension of his prior CVA. He is from home with his spouse.                  Action/Plan: PT recommending SNF. Palliative Care to meet with the family today to establish GOC. CM following for d/c disposition and needs.   Expected Discharge Date:                  Expected Discharge Plan:     In-House Referral:     Discharge planning Services     Post Acute Care Choice:    Choice offered to:     DME Arranged:    DME Agency:     HH Arranged:    HH Agency:     Status of Service:  In process, will continue to follow  If discussed at Long Length of Stay Meetings, dates discussed:    Additional Comments:  Kermit Balo, RN 07/14/2016, 11:16 AM

## 2016-07-14 NOTE — Progress Notes (Signed)
Nurse asked me in progression to see this pt, esp. his wife bedside who was depressed at his condition. Palliative mtg will be held later. Provided emotional/spiritual support and prayer -- first to wife, which she very much appreciated, then to pt. Per wife, she & pt have been married 18 yrs, since when she met him after his first wife died of Alzheimer's and he was her caregiver at home till she died. Since pt came to [facility] yesterday morningi (nstead of going back to [place], from where he'd recently been discharged), wife has been at pt's bedside. She is going home this afternoon to rest when pt's daughter comes, who'll be w/ pt tonight. Wife's son will bring her back in the morning.   Wife spoke for a time of her recent difficult emotional experiences, esp. how hard it was to lose those you love. She recently lost a "double cousin" who was like the sister she never had and an infant great-granddaughter who lived only 3 days. The latter loss was esp. complex for the family, as a healthy great-granddaughter was born about the same time, and the granddaughter/mom of that baby feels "guilty" that she has a healthy child and the other mom does not.   At that point wife asked me to pray w/ her husband. When I held his hand and got close to his face to speak loudly (wife said he was somewhat deaf), he grasped my hand and made good eye contact. Then it seemed as if he were praying w/ me. He also seemed to understand what I was saying when I explained what his wife had told me about who would be with him when. He replied conversationally as if he understood me. Though I could not understand much of what he said, I gathered from his demeanor his pleasure in talking about his family. At one point he clearly said "59," but am not sure in what context -- but that could have been yr he met his wife? Wife's son had told her to seek out a chaplain for prayer, and she was glad I came by. Chaplain available for f/u.    07/14/16 1000  Clinical Encounter Type  Visited With Patient and family together  Visit Type Initial;Psychological support;Spiritual support;Social support  Referral From Nurse  Spiritual Encounters  Spiritual Needs Prayer;Emotional  Stress Factors  Patient Stress Factors Health changes;Loss of control  Family Stress Factors Family relationships;Health changes;Loss of control   Gerrit Heck, Chaplain

## 2016-07-14 NOTE — Progress Notes (Signed)
STROKE TEAM PROGRESS NOTE   HISTORY OF PRESENT ILLNESS (per record) Dakota [name] is a 82 y.o. male who was [facility] presumed acute left carotid occlusion. He did well there, with some improvement in his aphasia and was discharged home Saturday. He did well on Sunday, however this morning on awakening he was completely aphasic. He was therefore brought then via [facility] as a code stroke. He was LKW 07/07/2016, time unknown. Patient was not administered IV t-PA secondary to being outside of window. He was admitted for further evaluation and treatment.   SUBJECTIVE (INTERVAL HISTORY) Patient's wife is at the bedside. She has decided to meet with palliative care team and discuss palliative care options. She states she had a tough time caring for the patient given prior to stroke at home   OBJECTIVE Temp:  [97.6 F (36.4 C)-98.1 F (36.7 C)] 97.9 F (36.6 C) (12/12 1420) Pulse Rate:  [75-92] 89 (12/12 1420) Cardiac Rhythm: Heart block;Normal sinus rhythm (12/11 1900) Resp:  [16-20] 20 (12/12 1420) BP: (126-161)/(47-96) 126/90 (12/12 1420) SpO2:  [92 %-100 %] 98 % (12/12 1420) Weight:  [84.3 kg (185 lb 13.6 oz)] 84.3 kg (185 lb 13.6 oz) (12/11 2100)  CBC:  Recent Labs Lab 07/10/16 1157 07/11/16 0435 07/13/16 1047 07/13/16 1052  WBC 8.6 5.2 11.8*  --   NEUTROABS 5.9  --  8.4*  --   HGB 12.5* 13.0 12.5* 14.3  HCT 38.7* 40.1 39.8 42.0  MCV 94.3 93.1 94.1  --   PLT 153 154 188  --     Basic Metabolic Panel:  Recent Labs Lab 07/11/16 1224 07/13/16 1047 07/13/16 1052  NA 137 140 139  K 3.8 4.2 4.2  CL 100* 102 101  CO2 28 30  --   GLUCOSE 129* 106* 104*  BUN 26* 21* 24*  CREATININE 1.39* 1.24 1.20  CALCIUM 8.8* 9.2  --     Lipid Panel:    Component Value Date/Time   CHOL 160 07/11/2016 1224   TRIG 99 07/11/2016 1224   HDL 49 07/11/2016 1224   CHOLHDL 3.3 07/11/2016 1224   VLDL 20 07/11/2016 1224   LDLCALC 91 07/11/2016 1224    HgbA1c:  Lab Results  Component Value Date   HGBA1C 5.9 (H) 07/10/2016   Urine Drug Screen: No results found for: LABOPIA, COCAINSCRNUR, LABBENZ, AMPHETMU, THCU, LABBARB    IMAGING  Dg Chest 2 View  Result Date: 07/13/2016 CLINICAL DATA:  Mental status changes. Respiratory distress. History of aortic valve replacement, previous CVA, coronary artery disease, former smoker, COPD. EXAM: CHEST  2 VIEW COMPARISON:  Portable chest x-ray of July 10, 2016 FINDINGS: The lungs are adequately inflated. There is no focal infiltrate. There is no pleural effusion. The patient has undergone previous CABG and aortic valve replacement. There is calcification in the wall of the thoracic aorta. The mediastinum is normal in width. The bony thorax exhibits no acute abnormality. IMPRESSION: There is no active cardiopulmonary disease. Previous CABG and aortic valve replacement. Thoracic aortic atherosclerosis. Electronically Signed   By: David  [place] M.D.   On: 07/13/2016 14:32   Ct Head Code Stroke W/o Cm  Result Date: 07/13/2016 CLINICAL DATA:  Code stroke. 82 year old male with right side weakness and aphasia. However, known left ICA occlusion and abnormal left MCA with some left MCA territory infarcts since 07/10/2016 [facility] CTA, MRI, and MRA studies. EXAM: CT HEAD WITHOUT CONTRAST TECHNIQUE: Contiguous axial images were obtained from  the base of the skull through the vertex without intravenous contrast. COMPARISON:  Noncontrast head CT, Brain MRI and intracranial MRA 07/10/2016, followed by CTA neck 07/10/2016 FINDINGS: Brain: Stable appearance of left temporal lobe and posterior left insula cortically based infarct which was restricted on diffusion 07/10/2016. New 2 cm area of cytotoxic edema at the junction of the posterior left temporal and left occipital lobes on series 21, image 17 today. Of note, lacunar type infarct in the ventral left thalamus is slightly less hypodense  today. Superimposed chronic right cerebellar PICA territory infarct. No other areas of acute cytotoxic edema. No acute intracranial hemorrhage identified. Stable ventricle size and configuration. No midline shift, mass effect, or evidence of intracranial mass lesion. Vascular: Hyperdense distal left MCA M1 and left MCA bifurcation, new from the presentation head CT on 07/10/2016. Abnormal left M1 on MRA that day. Superimposed Calcified atherosclerosis at the skull base. Skull: No acute osseous abnormality identified. Sinuses/Orbits: Left sphenoid sinus bubbly opacity is stable. Other Visualized paranasal sinuses and mastoids are stable and well pneumatized. Other: No acute orbit or scalp soft tissue finding. Small midline occipital scalp lipoma. ASPECTS [facility] Stroke Program Early CT Score) Total score (0-10 with 10 being normal): Not really applicable considering there is a mix of recent core infarct and new cytotoxic edema in the left MCA territory in the setting of left ICA occlusion for at least 3 days. IMPRESSION: 1. Suspect worsening left MCA flow/occlusion since the 07/10/2016 imaging studies. Core infarct in the anterior left temporal lobe appears stable from the MRI 3 days ago, but the left MCA bifurcation now is hyperdense and there is a new 2 cm area of cytotoxic edema in the posterior left temporal/left occipital confluence. 2. No associated hemorrhage or intracranial mass effect. 3. ASPECTS is not really applicable in this clinical setting, and the constellation of findings was discussed by telephone with Dr. Ritta Slot on 07/13/2016 at 1058 hours. Electronically Signed   By: Odessa Fleming M.D.   On: 07/13/2016 11:13    PHYSICAL EXAM Pleasant elderly Caucasian male currently not in distress. . Afebrile. Head is nontraumatic. Neck is supple without bruit.    Cardiac exam no murmur or gallop. Lungs are clear to auscultation. Distal pulses are well felt. Neurological Exam :  Awake alert globally  aphasic. Speech is nonfluent but not able to speak sentences. Follows only simple midline commands and gestures. Unable to name repeat. Extraocular moments are full range without nystagmus. Blinks to threat more on the left than the right. Mild right lower facial asymmetry. Tongue midline. Motor system exam able to move all 4 extremities against gravity with no lateralizing focal weakness. Deep tendon reflexes are symmetric. Plantars are downgoing. Gait was not tested  . ASSESSMENT/PLAN Mr. Dakota [name] is a 82 y.o. male with history of recent stroke due to carotid artery occlusion. Not felt to be a surgical candidate presenting with right-sided weakness and aphasia. He did not receive IV t-PA due to delay in arrival.   Stroke:  Left MCA infarct secondary to worsening L ICA flow/occlusion  CT head stable left MCA infarct in setting of worsening left ICA occlusion. New cytotoxic edema left MCA, posterior left temporal/left occipital area   Diet regular Room service appropriate? Yes; Fluid consistency: Thin  aspirin 81 mg daily and clopidogrel 75 mg daily prior to admission, now on No antithrombotic  Wife clear she does not want further intervention. Palliative care requested.  Therapy recommendations:  SNF  Disposition:  pending  Hypertension  Stable   Hyperlipidemia  Home meds:  lipitor 40  Other Stroke Risk Factors  Advanced age  Hx stroke/TIA   12/2 left MCA infarct due to left ICA occlusion, not a surgical candidate017   Coronary artery disease s/p CABG   aortic stenosis with history of replacement  Other Active Problems  COPD without exacerbation   Rheumatoid arthritis  Hospital day # 1  I have personally examined this patient, reviewed notes, independently viewed imaging studies, participated in medical decision making and plan of care.ROS completed by me personally and pertinent positives fully documented  I have made any additions or clarifications directly to  the above note. Patient presented with aphasia secondary to left carotid occlusion and presented with worsening of deficits a few days later. Patient's wife states his baseline is poor and she will not be able to care for him and palliative team has been consulted. Continue aspirin and Plavix and risk factor modification unless wife decides on full comfort care. Greater than 50% time during this 35 minute visit was spent on counseling and coordination of care about his stroke risk, prevention and treatment. Discussed with Dr. Theda Sers, MD Medical Director [facility] Pager: [phone] 07/14/2016 5:15 PM   To contact Stroke Continuity provider, please refer to [email or web address]. After hours, contact General Neurology

## 2016-07-14 NOTE — Evaluation (Signed)
Speech Language Pathology Evaluation Patient Details Name: Dakota [name] MRN: [id number] DOB: 07-30-1926 Today's Date: 07/14/2016 Time: 2440-1027 SLP Time Calculation (min) (ACUTE ONLY): 12 min  Problem List:  Patient Active Problem List   Diagnosis Date Noted  . 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 Cerebrovascular accident (CVA) (HCC)   . H/O aortic valve replacement   . Rheumatoid arthritis involving multiple sites (HCC)   . Expressive aphasia 07/11/2016  . Acute renal insufficiency 07/11/2016  . Carotid stenosis 07/11/2016  . Occlusion and stenosis of vertebral artery 07/11/2016  . COPD exacerbation (HCC) 07/11/2016  . Acute bronchitis 07/11/2016  . Receptive aphasia 07/11/2016  . Pyuria 07/11/2016  . CVA (cerebral vascular accident) (HCC) 07/10/2016  . Lymphedema 06/01/2016  . Atherosclerosis of coronary artery of native heart without angina pectoris 05/12/2016  . Essential hypertension 05/12/2016  . Hyperlipidemia 05/12/2016  . Pain in limb 05/12/2016  . Swelling of limb 05/12/2016   Past Medical History:  Past Medical History:  Diagnosis Date  . Anemia   . Aortic stenosis   . CAD (coronary artery disease)    s/p CABG  . COPD (chronic obstructive pulmonary disease) (HCC)   . Gout   . H/O aortic valve replacement   . Hyperlipidemia   . Hypertension   . Rheumatoid arthritis (HCC)   . Stroke [facility])    Past Surgical History:  Past Surgical History:  Procedure Laterality Date  . AORTIC VALVE REPLACEMENT (AVR)/CORONARY ARTERY BYPASS GRAFTING (CABG)    . CARDIAC VALVE REPLACEMENT    . CORONARY ARTERY BYPASS GRAFT    . EYE SURGERY     cataract  . forearm tendon surgery Right   . HERNIA REPAIR    . TONSILLECTOMY     HPI:  Patient is an 82 yo male admitted 07/13/16 with Rt-sided weakness, aphasia, Lt gaze preference. Recent admission 12/5-12-7/17 acute/subacute infarct involving the left superior temporal gyrus and sylvian fissure, acute/subacute non hemorrhagic  infarct within the medial anterior   Assessment / Plan / Recommendation Clinical Impression  Pt exhibits severe fluent aphasia marked by poor comprehension, jargon, neologisms, poor repetition and unaware of verbal errors. He is restless, impulsive and wife described agitation earlier. Educated wife re: communication with pt; remain as calm as possible. If pt perseverating and adament on thought which cannot be discrened, educated wife to sit back, maybe out of pt's site, distract him; leave subject alone unless it is safety concern. ST will follow.      SLP Assessment       Follow Up Recommendations  None    Frequency and Duration min 2x/week         SLP Evaluation Cognition  Overall Cognitive Status: Impaired/Different from baseline Arousal/Alertness: Awake/alert Orientation Level:  (inaccurate responses to y/n questions) Attention: Sustained Sustained Attention: Impaired Sustained Attention Impairment: Verbal basic Memory:  (not assessed at this time) Awareness: Impaired Awareness Impairment: Emergent impairment;Anticipatory impairment Problem Solving: Impaired Problem Solving Impairment: Functional basic Behaviors: Impulsive (physically agitated at times per wife) Safety/Judgment: Impaired       Comprehension  Auditory Comprehension Overall Auditory Comprehension: Impaired Yes/No Questions: Impaired Basic Biographical Questions: 0-25% accurate Commands: Impaired One Step Basic Commands: 0-24% accurate Interfering Components: Attention;Anxiety;Hearing (wife states HOH) Visual Recognition/Discrimination Discrimination: Not tested Reading Comprehension Reading Status:  (single words not assist comprehension)    Expression Expression Primary Mode of Expression: Verbal Verbal Expression Overall Verbal Expression: Impaired Initiation: No impairment Level of Generative/Spontaneous Verbalization: Sentence Repetition: Impaired Level of Impairment: Word level Naming:  Impairment Responsive: Not tested Confrontation: Impaired (0%) Verbal Errors: Jargon;Neologisms;Not aware of errors Pragmatics: Impairment Impairments: Topic appropriateness;Turn Taking Interfering Components: Attention Written Expression Dominant Hand: Right Written Expression: Not tested (TBA)   Oral / Motor  Oral Motor/Sensory Function Overall Oral Motor/Sensory Function:  (unable to follow commands) Motor Speech Overall Motor Speech: Impaired Respiration: Within functional limits Phonation: Normal Resonance: Within functional limits Articulation: Impaired Level of Impairment: Word Intelligibility: Intelligibility reduced Word: 25-49% accurate Phrase: 0-24% accurate Motor Planning: Impaired Level of Impairment: Word Motor Speech Errors: Unaware;Inconsistent Interfering Components: Hearing loss   GO                    [name] 07/14/2016, 10:19 AM   [name] Lonell Face[employer] [phone]

## 2016-07-15 MED ORDER — GLYCOPYRROLATE 0.2 MG/ML IJ SOLN
0.4000 mg | Freq: Three times a day (TID) | INTRAMUSCULAR | Status: DC
  Administered 2016-07-15 (×2): 0.4 mg via INTRAVENOUS
  Filled 2016-07-15 (×2): qty 2

## 2016-07-15 MED ORDER — MORPHINE SULFATE (CONCENTRATE) 10 MG/0.5ML PO SOLN: 5.0000 mg | ORAL | 0 refills | Status: AC | PRN

## 2016-07-15 NOTE — Progress Notes (Signed)
Seen and examined, chart reviewed. Spouse at bedside. He is clearly aspirating secretions (gurgling)-suspect that he would best benefit from transfer to residential hospice. Full note to follow

## 2016-07-15 NOTE — Discharge Summary (Signed)
PATIENT DETAILS Name: Dakota [name] Age: 82 y.o. Sex: male Date of Birth: 12/07/1925 MRN: [id number]. Admitting Physician: Ozella Rocks, MD FMB:[id number] Yetta Barre, MD  Admit Date: 07/13/2016 Discharge date: 07/15/2016  Recommendations for Outpatient Follow-up:  1. Follow up with PCP if needed 2. Goals are for comfort-optimize comfort care.  Admitted From:  Home  Disposition: Residential Hospice     Home Health: No  Equipment/Devices: None  Discharge Condition: Hospice  CODE STATUS:  DNR  Diet recommendation:  Comfort feeding  Brief Summary: See H&P, Labs, Consult and Test reports for all details in brief,Dakota Swinsonis a 82 y.o.malewith recent admission for stroke from December 5, discharged on December 9, after presenting with Dakota right sided weakness, and speech difficulty, in the setting of 100 % L carotid artery occlusion. He underwent extensive workup at the hospital and his symptoms had improved upon discharge. At the time, he was not deemed to be a surgical candidate due to age and multiple commorbidities. He presented to [facility] on 07/13/16 with similar symptoms including receptive and expressive aphasia, right sided weakness, confusion. After evaluation by neurology-the family decided to transition to full comfort measures. 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 Not a TPA candidate.CT head shows likely progression of his stroke . He has significant aphasia and dysphagia-and seems to be actively aspirating his secretions. Family not interested in pursuing any further workup, seen by palliative care-goes up mostly for comfort. Recommendations are to transition to residential hospice when bed available. Does not require aspirin or Plavix any further as goals  are for comfort. Family agreeable with comfort feeding  Hypertension: Relatively well controlled-not a candidate for further antihypertensive therapy-as goals are mostly for comfort.   Hyperlipidemia: No role for statins-comfort care.  COPD: No symptoms of acute exacerbation-continue bronchodilators for comfort.   DJT:TSVX  ASA, Plavix, statin-as full comfort measures  RA-stopped plaquenil -as full comfort measures  Palliative care: Unfortunate elderly male with multiple medical problems-presented with acute CVA now with significant aphasia and dysphagia. He appears to be actively aspirating his secretions this morning. He is a very poor overall prognoses, and I suspect that his life expectancy is less than a few weeks. Seen by palliative care, agree with placement to residential hospice. Family agreeable as well.  Discharge Diagnoses:  Principal Problem:   Cerebrovascular accident (CVA) (HCC) Active Problems:   Atherosclerosis of coronary artery of native heart without angina pectoris   Essential hypertension   Hyperlipidemia   Expressive aphasia   Carotid stenosis   Receptive aphasia   H/O aortic valve replacement   Rheumatoid arthritis involving multiple sites [facility])   DNR (do not resuscitate)   Palliative care by specialist   Dysphagia   Aphasia  Discharge Instructions:  Activity:  As tolerated with Full fall precautions use walker/cane & assistance as needed   Discharge Instructions    Activity as tolerated - No restrictions    Complete by:  As directed        Medication List    STOP taking these medications   albuterol 108 (90 Base) MCG/ACT inhaler Commonly known as:  PROVENTIL HFA;VENTOLIN HFA   allopurinol 300 MG tablet Commonly known as:  ZYLOPRIM   aspirin EC 81 MG tablet   atorvastatin 40 MG tablet Commonly known as:  LIPITOR  clopidogrel 75 MG tablet Commonly known as:  PLAVIX   furosemide 20 MG tablet Commonly known as:  LASIX     hydroxychloroquine 200 MG tablet Commonly known as:  PLAQUENIL   levofloxacin 750 MG tablet Commonly known as:  LEVAQUIN   metoprolol tartrate 25 MG tablet Commonly known as:  LOPRESSOR   predniSONE 10 MG (21) Tbpk tablet Commonly known as:  STERAPRED UNI-PAK 21 TAB   predniSONE 5 MG tablet Commonly known as:  DELTASONE   triamcinolone cream 0.1 % Commonly known as:  KENALOG     TAKE these medications   ipratropium-albuterol 0.5-2.5 (3) MG/3ML Soln Commonly known as:  DUONEB Take 3 mLs by nebulization every 4 (four) hours.   morphine CONCENTRATE 10 MG/0.5ML Soln concentrated solution Take 0.25 mLs (5 mg total) by mouth every hour as needed for moderate pain or shortness of breath.      Follow-up Information    Elizabeth Sauereanna Jones, MD Follow up.   Specialty:  Family Medicine Why:  if needed Contact information: [address] [place] [id number] [phone]          Allergies  Allergen Reactions  . Colchicine Shortness Of Breath  . Penicillin G Rash    Consultations:   neurology and Palliative care  Other Procedures/Studies: Dg Chest 1 View  Result Date: 07/10/2016 CLINICAL DATA:  Expressive aphasia, dyspnea EXAM: CHEST 1 VIEW COMPARISON:  06/12/2010 FINDINGS: Cardiac shadow is within normal limits. Postsurgical changes are noted consistent with bypass grafting and aortic valve replacement. The lungs are well aerated bilaterally. No focal infiltrate or sizable effusion is seen. No acute bony abnormality is noted. IMPRESSION: No active disease. Electronically Signed   By: Alcide CleverMark  Lukens M.D.   On: 07/10/2016 16:07   Dg Chest 2 View  Result Date: 07/13/2016 CLINICAL DATA:  Mental status changes. Respiratory distress. History of aortic valve replacement, previous CVA, coronary artery disease, former smoker, COPD. EXAM: CHEST  2 VIEW COMPARISON:  Portable chest x-ray of July 10, 2016 FINDINGS: The lungs are adequately inflated. There is no focal  infiltrate. There is no pleural effusion. The patient has undergone previous CABG and aortic valve replacement. There is calcification in the wall of the thoracic aorta. The mediastinum is normal in width. The bony thorax exhibits no acute abnormality. IMPRESSION: There is no active cardiopulmonary disease. Previous CABG and aortic valve replacement. Thoracic aortic atherosclerosis. Electronically Signed   By: David  SwazilandJordan M.D.   On: 07/13/2016 14:32   Ct Head Wo Contrast  Result Date: 07/10/2016 CLINICAL DATA:  Altered mental status for 4 days.  No known injury. EXAM: CT HEAD WITHOUT CONTRAST TECHNIQUE: Contiguous axial images were obtained from the base of the skull through the vertex without intravenous contrast. COMPARISON:  None. FINDINGS: Brain: Hypoattenuation is seen in the anterior aspect of the left temporal lobe with an appearance most suspicious for subacute infarct. 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 The brain is atrophic with chronic microvascular ischemic change. Remote lacunar infarction in the left thalamus is identified. No hemorrhage, midline shift or abnormal extra-axial fluid collection is seen. Vascular: Atherosclerosis noted.  No hyperdense vessel identified. Skull: Intact. Sinuses/Orbits: Small amount of secretions in the left sphenoid sinus noted. Other: None. IMPRESSION: Hypoattenuation in the anterior left temporal lobe most consistent with early subacute to subacute infarct. Atrophy and chronic microvascular ischemic change. Remote right cerebellar infarct noted. Atherosclerosis. Electronically Signed   By: Drusilla Kannerhomas  Dalessio M.D.   On: 07/10/2016 12:31   Ct  Angio Neck W Or Wo Contrast  Result Date: 07/10/2016 CLINICAL DATA:  Carotid stenosis.  Acute infarct. EXAM: CT ANGIOGRAPHY NECK TECHNIQUE: Multidetector CT imaging of the neck was performed using the standard protocol during bolus administration of intravenous contrast. Multiplanar CT image reconstructions and MIPs  were obtained to evaluate the vascular anatomy. Carotid stenosis measurements (when applicable) are obtained utilizing NASCET criteria, using the distal internal carotid diameter as the denominator. CONTRAST:  75 cc Isovue 370 intravenous COMPARISON:  None. FINDINGS: Aortic arch: Extensive diffuse irregular atherosclerotic plaque. Two vessel branching Right carotid system: Diffuse predominately noncalcified atheromatous wall thickening. Bulky plaque on the common carotid and proximal ICA with proximal ICA narrowing measuring up to 50-60 percent (best measured on sagittal reformats). Maximal narrowing is in the proximal ICA below the level of the mandibular angle. No ulceration or dissection noted. Left carotid system: Diffuse noncalcified atheromatous wall thickening of the common carotid artery. Bulky plaque at the bifurcation and proximal ICA with occlusion just beyond the bifurcation. Vertebral arteries: Heavy noncalcified plaque deposition in the proximal left subclavian without flow limiting stenosis. The dominant left vertebral artery is widely patent to the dura.Non dominant right vertebral artery is occluded at its origin with thready intermittent reconstitution in the neck, faintly seen at the vertebrobasilar junction. Faint flow seen in the right PICA Skeleton: Diffuse advanced disc degeneration with kyphotic deformity of the cervical spine. At least moderate spinal stenosis greatest at C3-4. Multilevel foraminal narrowing. Other neck: No incidental mass or adenopathy. Upper chest: Subpleural nodularity in the right upper lobe dominant nodule measuring 9 mm. Paraseptal emphysema. Status post CABG. IMPRESSION: 1. 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 Faint reconstitution seen at the cavernous ICA. 2. 50-60% proximal right ICA narrowing. 3. Non dominant right vertebral artery occlusion at the ostium with thready intermittent reconstitution. The vessel does not provide significant flow to the basilar. The  dominant left vertebral artery is smooth and widely patent. 4. Advanced atherosclerosis 5. Subpleural opacity in the right upper lobe could be atelectasis, infection, or neoplasm. After stabilization, recommend diagnostic chest CT. If there are infectious signs, would image after antibiotics. Electronically Signed   By: Marnee Spring M.D.   On: 07/10/2016 19:09   Mr Maxine Glenn Head Wo Contrast  Result Date: 07/10/2016 CLINICAL DATA:  Altered mental status for 4 days. Aphasia. No focal weakness. EXAM: MRI HEAD WITHOUT CONTRAST MRA HEAD WITHOUT CONTRAST TECHNIQUE: Multiplanar, multiecho pulse sequences of the brain and surrounding structures were obtained without intravenous contrast. Angiographic images of the head were obtained using MRA technique without contrast. COMPARISON:  CT head without contrast 07/10/2016. FINDINGS: MRI HEAD FINDINGS Brain: The diffusion-weighted images confirm an acute nonhemorrhagic infarct involving the left temporal lobe and posterior sylvian fissure. There is also a focal area of acute infarction within the anterior left thalamus. T2 changes are associated with the areas of acute/ subacute infarction. Moderate generalized atrophy and white matter changes are present otherwise. A remote inferior right cerebellar infarct is present. Remote lacunar infarcts are present in the left cerebellum. The study is mildly degraded by patient motion. Vascular: The left internal carotid artery is occluded. It is reconstituted at the left ICA terminus. There is flow in the right internal carotid artery and both vertebral arteries. Skull and upper cervical spine: The skullbase is within normal limits. Moderate narrowing is present at C2-3 and C3-4. Midline sagittal structures are unremarkable. Sinuses/Orbits: The paranasal sinuses and mastoid air cells are clear. A right lens replacement is noted. The globes  and orbits are otherwise within normal limits. MRA HEAD FINDINGS A left internal carotid artery  is occluded. Flow is present in the right internal carotid artery from the high cervical segments through the ICA terminus. There is flow in the A1 segments bilaterally. The anterior communicating artery is patent. ACA branch vessels are within normal limits. There is flow in the left M1 segment. Signal is less robust than on the right. There is some irregularity in the right M1 segment. The MCA bifurcations are intact. There is attenuation of MCA branch vessels, left greater than right. The left vertebral artery is the dominant vessel. There is signal loss in the distal right vertebral artery suggesting high-grade stenosis or occlusion. The basilar artery is normal. Both posterior cerebral arteries originate from the basilar tip. There is asymmetric attenuation of distal right PCA branch vessels. IMPRESSION: 1. Occluded left internal carotid artery. 2. Although there is flow across the anterior communicating artery, flow signal is decreased in the left M1 segment compared to the right and there is advanced attenuation of left MCA branch vessels compared to the right. 3. Moderate diffuse small vessel disease. 4. High-grade stenosis or occlusion of distal right vertebral artery. 5. Acute/subacute nonhemorrhagic infarct involving the left superior temporal gyrus and sylvian fissure 6. Acute/subacute non hemorrhagic infarct within the medial anterior left thalamus. 7. Moderate diffuse atrophy and white matter disease otherwise. 8. Remote cerebellar infarcts as described. Electronically Signed   By: Marin Roberts M.D.   On: 07/10/2016 16:15   Mr Brain Wo Contrast  Result Date: 07/10/2016 CLINICAL DATA:  Altered mental status for 4 days. Aphasia. No focal weakness. EXAM: MRI HEAD WITHOUT CONTRAST MRA HEAD WITHOUT CONTRAST TECHNIQUE: Multiplanar, multiecho pulse sequences of the brain and surrounding structures were obtained without intravenous contrast. Angiographic images of the head were obtained using MRA  technique without contrast. COMPARISON:  CT head without contrast 07/10/2016. FINDINGS: MRI HEAD FINDINGS Brain: The diffusion-weighted images confirm an acute nonhemorrhagic infarct involving the left temporal lobe and posterior sylvian fissure. There is also a focal area of acute infarction within the anterior left thalamus. T2 changes are associated with the areas of acute/ subacute infarction. Moderate generalized atrophy and white matter changes are present otherwise. A remote inferior right cerebellar infarct is present. Remote lacunar infarcts are present in the left cerebellum. The study is mildly degraded by patient motion. Vascular: The left internal carotid artery is occluded. It is reconstituted at the left ICA terminus. There is flow in the right internal carotid artery and both vertebral arteries. Skull and upper cervical spine: The skullbase is within normal limits. Moderate narrowing is present at C2-3 and C3-4. Midline sagittal structures are unremarkable. Sinuses/Orbits: The paranasal sinuses and mastoid air cells are clear. A right lens replacement is noted. The globes and orbits are otherwise within normal limits. MRA HEAD FINDINGS A left internal carotid artery is occluded. Flow is present in the right internal carotid artery from the high cervical segments through the ICA terminus. There is flow in the A1 segments bilaterally. The anterior communicating artery is patent. ACA branch vessels are within normal limits. There is flow in the left M1 segment. Signal is less robust than on the right. There is some irregularity in the right M1 segment. The MCA bifurcations are intact. There is attenuation of MCA branch vessels, left greater than right. The left vertebral artery is the dominant vessel. There is signal loss in the distal right vertebral artery suggesting high-grade stenosis or occlusion. The  basilar artery is normal. Both posterior cerebral arteries originate from the basilar tip. There  is asymmetric attenuation of distal right PCA branch vessels. IMPRESSION: 1. Occluded left internal carotid artery. 2. Although there is flow across the anterior communicating artery, flow signal is decreased in the left M1 segment compared to the right and there is advanced attenuation of left MCA branch vessels compared to the right. 3. Moderate diffuse small vessel disease. 4. High-grade stenosis or occlusion of distal right vertebral artery. 5. Acute/subacute nonhemorrhagic infarct involving the left superior temporal gyrus and sylvian fissure 6. Acute/subacute non hemorrhagic infarct within the medial anterior left thalamus. 7. Moderate diffuse atrophy and white matter disease otherwise. 8. Remote cerebellar infarcts as described. Electronically Signed   By: Marin Roberts M.D.   On: 07/10/2016 16:15   US Carotid Bilateral  Result Date: 07/10/2016 CLINICAL DATA:  CVA and altered speech. EXAM: BILATERAL CAROTID DUPLEX ULTRASOUND TECHNIQUE: Wallace Cullens scale imaging, color Doppler and duplex ultrasound were performed of bilateral carotid and vertebral arteries in the neck. COMPARISON:  Head CT 07/10/2016 FINDINGS: Criteria: Quantification of carotid stenosis is based on velocity parameters that correlate the residual internal carotid diameter with NASCET-based stenosis levels, using the diameter of the distal internal carotid lumen as the denominator for stenosis measurement. The following velocity measurements were obtained: RIGHT ICA:  104 cm/sec CCA:  88 cm/sec SYSTOLIC ICA/CCA RATIO:  1.3 DIASTOLIC ICA/CCA RATIO:  1.2 ECA:  138 cm/sec LEFT ICA:  Occluded CCA:  60 cm/sec ECA:  94 cm/sec RIGHT CAROTID ARTERY: Scattered echogenic plaque throughout the right common carotid artery. Calcified plaque at the right carotid bulb. Plaque in the external carotid artery. External carotid artery is patent with normal waveform. Scattered plaque throughout the proximal internal carotid artery. Normal waveforms and  velocities in the internal carotid artery. RIGHT VERTEBRAL ARTERY: Antegrade flow and normal waveform in the right vertebral artery. LEFT CAROTID ARTERY: Plaque in the left common carotid artery. Left common carotid artery is patent. Left carotid bulb is patent. Echogenic plaque involving the left external carotid artery. The origin of the left internal carotid artery is patent but there is a large amount of plaque just beyond the origin. Mid and distal left internal carotid artery are occluded. LEFT VERTEBRAL ARTERY: Antegrade flow and normal waveform in the left vertebral artery. IMPRESSION: Left internal carotid artery is occluded just beyond its origin. Chronicity of this finding is unknown. Atherosclerotic disease in bilateral carotid arteries. Estimated degree of stenosis in the right internal carotid artery is less than 50%. Patent vertebral arteries. These results will be called to the ordering clinician or representative by the Radiologist Assistant, and communication documented in the PACS or zVision Dashboard. Electronically Signed   By: Richarda Overlie M.D.   On: 07/10/2016 15:46   Ct Head Code Stroke W/o Cm  Result Date: 07/13/2016 CLINICAL DATA:  Code stroke. 82 year old male with right side weakness and aphasia. However, known left ICA occlusion and abnormal left MCA with some left MCA territory infarcts since 07/10/2016 [facility] CTA, MRI, and MRA studies. EXAM: CT HEAD WITHOUT CONTRAST TECHNIQUE: Contiguous axial images were obtained from the base of the skull through the vertex without intravenous contrast. COMPARISON:  Noncontrast head CT, Brain MRI and intracranial MRA 07/10/2016, followed by CTA neck 07/10/2016 FINDINGS: Brain: Stable appearance of left temporal lobe and posterior left insula cortically based infarct which was restricted on diffusion 07/10/2016. Dakota 2 cm area of cytotoxic edema at the junction of the posterior  left temporal and left occipital lobes on  series 21, image 17 today. Of note, lacunar type infarct in the ventral left thalamus is slightly less hypodense today. Superimposed chronic right cerebellar PICA territory infarct. No other areas of acute cytotoxic edema. No acute intracranial hemorrhage identified. Stable ventricle size and configuration. No midline shift, mass effect, or evidence of intracranial mass lesion. Vascular: Hyperdense distal left MCA M1 and left MCA bifurcation, Dakota from the presentation head CT on 07/10/2016. Abnormal left M1 on MRA that day. Superimposed Calcified atherosclerosis at the skull base. Skull: No acute osseous abnormality identified. Sinuses/Orbits: Left sphenoid sinus bubbly opacity is stable. Other Visualized paranasal sinuses and mastoids are stable and well pneumatized. Other: No acute orbit or scalp soft tissue finding. Small midline occipital scalp lipoma. ASPECTS [facility] Stroke Program Early CT Score) Total score (0-10 with 10 being normal): Not really applicable considering there is a mix of recent core infarct and Dakota cytotoxic edema in the left MCA territory in the setting of left ICA occlusion for at least 3 days. IMPRESSION: 1. Suspect worsening left MCA flow/occlusion since the 07/10/2016 imaging studies. Core infarct in the anterior left temporal lobe appears stable from the MRI 3 days ago, but the left MCA bifurcation now is hyperdense and there is a Dakota 2 cm area of cytotoxic edema in the posterior left temporal/left occipital confluence. 2. No associated hemorrhage or intracranial mass effect. 3. ASPECTS is not really applicable in this clinical setting, and the constellation of findings was discussed by telephone with Dr. Ritta Slot on 07/13/2016 at 1058 hours. Electronically Signed   By: Odessa Fleming M.D.   On: 07/13/2016 11:13      TODAY-DAY OF DISCHARGE:  Subjective:   Dakota [name] remains aphasic-he appears to be gurgling-and actively aspirating secretions.  Objective:   Blood  pressure 127/63, pulse 100, temperature 97.9 F (36.6 C), temperature source Oral, resp. rate 17, height 5\' 8"  (1.727 m), weight 84.3 kg (185 lb 13.6 oz), SpO2 97 %.  Intake/Output Summary (Last 24 hours) at 07/15/16 1106 Last data filed at 07/14/16 2300  Gross per 24 hour  Intake              120 ml  Output              450 ml  Net             -330 ml   Filed Weights   07/13/16 1104 07/13/16 2100  Weight: 84.3 kg (185 lb 13.6 oz) 84.3 kg (185 lb 13.6 oz)    Exam: Awake Alert, Aphasic Dakota [name].AT,PERRAL Supple Neck,No JVD, No cervical lymphadenopathy appriciated.  Symmetrical Chest wall movement, Good air movement bilaterally, bibasilar rales RRR,No Gallops,Rubs or Dakota Murmurs, No Parasternal Heave +ve B.Sounds, Abd Soft, Non tender, No organomegaly appriciated, No rebound -guarding or rigidity. No Cyanosis, Clubbing or edema, No Dakota Rash or bruise   PERTINENT RADIOLOGIC STUDIES: Dg Chest 1 View  Result Date: 07/10/2016 CLINICAL DATA:  Expressive aphasia, dyspnea EXAM: CHEST 1 VIEW COMPARISON:  06/12/2010 FINDINGS: Cardiac shadow is within normal limits. Postsurgical changes are noted consistent with bypass grafting and aortic valve replacement. The lungs are well aerated bilaterally. No focal infiltrate or sizable effusion is seen. No acute bony abnormality is noted. IMPRESSION: No active disease. Electronically Signed   By: Alcide Clever M.D.   On: 07/10/2016 16:07   Dg Chest 2 View  Result Date: 07/13/2016 CLINICAL DATA:  Mental status changes. Respiratory distress. History of aortic  valve replacement, previous CVA, coronary artery disease, former smoker, COPD. EXAM: CHEST  2 VIEW COMPARISON:  Portable chest x-ray of July 10, 2016 FINDINGS: The lungs are adequately inflated. There is no focal infiltrate. There is no pleural effusion. The patient has undergone previous CABG and aortic valve replacement. There is calcification in the wall of the thoracic aorta. The mediastinum is normal  in width. The bony thorax exhibits no acute abnormality. IMPRESSION: There is no active cardiopulmonary disease. Previous CABG and aortic valve replacement. Thoracic aortic atherosclerosis. Electronically Signed   By: David  Swaziland M.D.   On: 07/13/2016 14:32   Ct Head Wo Contrast  Result Date: 07/10/2016 CLINICAL DATA:  Altered mental status for 4 days.  No known injury. EXAM: CT HEAD WITHOUT CONTRAST TECHNIQUE: Contiguous axial images were obtained from the base of the skull through the vertex without intravenous contrast. COMPARISON:  None. FINDINGS: Brain: Hypoattenuation is seen in the anterior aspect of the left temporal lobe with an appearance most suspicious for subacute infarct. 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 The brain is atrophic with chronic microvascular ischemic change. Remote lacunar infarction in the left thalamus is identified. No hemorrhage, midline shift or abnormal extra-axial fluid collection is seen. Vascular: Atherosclerosis noted.  No hyperdense vessel identified. Skull: Intact. Sinuses/Orbits: Small amount of secretions in the left sphenoid sinus noted. Other: None. IMPRESSION: Hypoattenuation in the anterior left temporal lobe most consistent with early subacute to subacute infarct. Atrophy and chronic microvascular ischemic change. Remote right cerebellar infarct noted. Atherosclerosis. Electronically Signed   By: Drusilla Kanner M.D.   On: 07/10/2016 12:31   Ct Angio Neck W Or Wo Contrast  Result Date: 07/10/2016 CLINICAL DATA:  Carotid stenosis.  Acute infarct. EXAM: CT ANGIOGRAPHY NECK TECHNIQUE: Multidetector CT imaging of the neck was performed using the standard protocol during bolus administration of intravenous contrast. Multiplanar CT image reconstructions and MIPs were obtained to evaluate the vascular anatomy. Carotid stenosis measurements (when applicable) are obtained utilizing NASCET criteria, using the distal internal carotid diameter as the  denominator. CONTRAST:  75 cc Isovue 370 intravenous COMPARISON:  None. FINDINGS: Aortic arch: Extensive diffuse irregular atherosclerotic plaque. Two vessel branching Right carotid system: Diffuse predominately noncalcified atheromatous wall thickening. Bulky plaque on the common carotid and proximal ICA with proximal ICA narrowing measuring up to 50-60 percent (best measured on sagittal reformats). Maximal narrowing is in the proximal ICA below the level of the mandibular angle. No ulceration or dissection noted. Left carotid system: Diffuse noncalcified atheromatous wall thickening of the common carotid artery. Bulky plaque at the bifurcation and proximal ICA with occlusion just beyond the bifurcation. Vertebral arteries: Heavy noncalcified plaque deposition in the proximal left subclavian without flow limiting stenosis. The dominant left vertebral artery is widely patent to the dura.Non dominant right vertebral artery is occluded at its origin with thready intermittent reconstitution in the neck, faintly seen at the vertebrobasilar junction. Faint flow seen in the right PICA Skeleton: Diffuse advanced disc degeneration with kyphotic deformity of the cervical spine. At least moderate spinal stenosis greatest at C3-4. Multilevel foraminal narrowing. Other neck: No incidental mass or adenopathy. Upper chest: Subpleural nodularity in the right upper lobe dominant nodule measuring 9 mm. Paraseptal emphysema. Status post CABG. IMPRESSION: 1. 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 Faint reconstitution seen at the cavernous ICA. 2. 50-60% proximal right ICA narrowing. 3. Non dominant right vertebral artery occlusion at the ostium with thready intermittent reconstitution. The vessel does not provide significant flow to the basilar.  The dominant left vertebral artery is smooth and widely patent. 4. Advanced atherosclerosis 5. Subpleural opacity in the right upper lobe could be atelectasis, infection, or neoplasm. After  stabilization, recommend diagnostic chest CT. If there are infectious signs, would image after antibiotics. Electronically Signed   By: Marnee Spring M.D.   On: 07/10/2016 19:09   Mr Maxine Glenn Head Wo Contrast  Result Date: 07/10/2016 CLINICAL DATA:  Altered mental status for 4 days. Aphasia. No focal weakness. EXAM: MRI HEAD WITHOUT CONTRAST MRA HEAD WITHOUT CONTRAST TECHNIQUE: Multiplanar, multiecho pulse sequences of the brain and surrounding structures were obtained without intravenous contrast. Angiographic images of the head were obtained using MRA technique without contrast. COMPARISON:  CT head without contrast 07/10/2016. FINDINGS: MRI HEAD FINDINGS Brain: The diffusion-weighted images confirm an acute nonhemorrhagic infarct involving the left temporal lobe and posterior sylvian fissure. There is also a focal area of acute infarction within the anterior left thalamus. T2 changes are associated with the areas of acute/ subacute infarction. Moderate generalized atrophy and white matter changes are present otherwise. A remote inferior right cerebellar infarct is present. Remote lacunar infarcts are present in the left cerebellum. The study is mildly degraded by patient motion. Vascular: The left internal carotid artery is occluded. It is reconstituted at the left ICA terminus. There is flow in the right internal carotid artery and both vertebral arteries. Skull and upper cervical spine: The skullbase is within normal limits. Moderate narrowing is present at C2-3 and C3-4. Midline sagittal structures are unremarkable. Sinuses/Orbits: The paranasal sinuses and mastoid air cells are clear. A right lens replacement is noted. The globes and orbits are otherwise within normal limits. MRA HEAD FINDINGS A left internal carotid artery is occluded. Flow is present in the right internal carotid artery from the high cervical segments through the ICA terminus. There is flow in the A1 segments bilaterally. The anterior  communicating artery is patent. ACA branch vessels are within normal limits. There is flow in the left M1 segment. Signal is less robust than on the right. There is some irregularity in the right M1 segment. The MCA bifurcations are intact. There is attenuation of MCA branch vessels, left greater than right. The left vertebral artery is the dominant vessel. There is signal loss in the distal right vertebral artery suggesting high-grade stenosis or occlusion. The basilar artery is normal. Both posterior cerebral arteries originate from the basilar tip. There is asymmetric attenuation of distal right PCA branch vessels. IMPRESSION: 1. Occluded left internal carotid artery. 2. Although there is flow across the anterior communicating artery, flow signal is decreased in the left M1 segment compared to the right and there is advanced attenuation of left MCA branch vessels compared to the right. 3. Moderate diffuse small vessel disease. 4. High-grade stenosis or occlusion of distal right vertebral artery. 5. Acute/subacute nonhemorrhagic infarct involving the left superior temporal gyrus and sylvian fissure 6. Acute/subacute non hemorrhagic infarct within the medial anterior left thalamus. 7. Moderate diffuse atrophy and white matter disease otherwise. 8. Remote cerebellar infarcts as described. Electronically Signed   By: Marin Roberts M.D.   On: 07/10/2016 16:15   Mr Brain Wo Contrast  Result Date: 07/10/2016 CLINICAL DATA:  Altered mental status for 4 days. Aphasia. No focal weakness. EXAM: MRI HEAD WITHOUT CONTRAST MRA HEAD WITHOUT CONTRAST TECHNIQUE: Multiplanar, multiecho pulse sequences of the brain and surrounding structures were obtained without intravenous contrast. Angiographic images of the head were obtained using MRA technique without contrast. COMPARISON:  CT head  without contrast 07/10/2016. FINDINGS: MRI HEAD FINDINGS Brain: The diffusion-weighted images confirm an acute nonhemorrhagic infarct  involving the left temporal lobe and posterior sylvian fissure. There is also a focal area of acute infarction within the anterior left thalamus. T2 changes are associated with the areas of acute/ subacute infarction. Moderate generalized atrophy and white matter changes are present otherwise. A remote inferior right cerebellar infarct is present. Remote lacunar infarcts are present in the left cerebellum. The study is mildly degraded by patient motion. Vascular: The left internal carotid artery is occluded. It is reconstituted at the left ICA terminus. There is flow in the right internal carotid artery and both vertebral arteries. Skull and upper cervical spine: The skullbase is within normal limits. Moderate narrowing is present at C2-3 and C3-4. Midline sagittal structures are unremarkable. Sinuses/Orbits: The paranasal sinuses and mastoid air cells are clear. A right lens replacement is noted. The globes and orbits are otherwise within normal limits. MRA HEAD FINDINGS A left internal carotid artery is occluded. Flow is present in the right internal carotid artery from the high cervical segments through the ICA terminus. There is flow in the A1 segments bilaterally. The anterior communicating artery is patent. ACA branch vessels are within normal limits. There is flow in the left M1 segment. Signal is less robust than on the right. There is some irregularity in the right M1 segment. The MCA bifurcations are intact. There is attenuation of MCA branch vessels, left greater than right. The left vertebral artery is the dominant vessel. There is signal loss in the distal right vertebral artery suggesting high-grade stenosis or occlusion. The basilar artery is normal. Both posterior cerebral arteries originate from the basilar tip. There is asymmetric attenuation of distal right PCA branch vessels. IMPRESSION: 1. Occluded left internal carotid artery. 2. Although there is flow across the anterior communicating artery,  flow signal is decreased in the left M1 segment compared to the right and there is advanced attenuation of left MCA branch vessels compared to the right. 3. Moderate diffuse small vessel disease. 4. High-grade stenosis or occlusion of distal right vertebral artery. 5. Acute/subacute nonhemorrhagic infarct involving the left superior temporal gyrus and sylvian fissure 6. Acute/subacute non hemorrhagic infarct within the medial anterior left thalamus. 7. Moderate diffuse atrophy and white matter disease otherwise. 8. Remote cerebellar infarcts as described. Electronically Signed   By: Marin Roberts M.D.   On: 07/10/2016 16:15   US Carotid Bilateral  Result Date: 07/10/2016 CLINICAL DATA:  CVA and altered speech. EXAM: BILATERAL CAROTID DUPLEX ULTRASOUND TECHNIQUE: Wallace Cullens scale imaging, color Doppler and duplex ultrasound were performed of bilateral carotid and vertebral arteries in the neck. COMPARISON:  Head CT 07/10/2016 FINDINGS: Criteria: Quantification of carotid stenosis is based on velocity parameters that correlate the residual internal carotid diameter with NASCET-based stenosis levels, using the diameter of the distal internal carotid lumen as the denominator for stenosis measurement. The following velocity measurements were obtained: RIGHT ICA:  104 cm/sec CCA:  88 cm/sec SYSTOLIC ICA/CCA RATIO:  1.3 DIASTOLIC ICA/CCA RATIO:  1.2 ECA:  138 cm/sec LEFT ICA:  Occluded CCA:  60 cm/sec ECA:  94 cm/sec RIGHT CAROTID ARTERY: Scattered echogenic plaque throughout the right common carotid artery. Calcified plaque at the right carotid bulb. Plaque in the external carotid artery. External carotid artery is patent with normal waveform. Scattered plaque throughout the proximal internal carotid artery. Normal waveforms and velocities in the internal carotid artery. RIGHT VERTEBRAL ARTERY: Antegrade flow and normal waveform in the right  vertebral artery. LEFT CAROTID ARTERY: Plaque in the left common carotid  artery. Left common carotid artery is patent. Left carotid bulb is patent. Echogenic plaque involving the left external carotid artery. The origin of the left internal carotid artery is patent but there is a large amount of plaque just beyond the origin. Mid and distal left internal carotid artery are occluded. LEFT VERTEBRAL ARTERY: Antegrade flow and normal waveform in the left vertebral artery. IMPRESSION: Left internal carotid artery is occluded just beyond its origin. Chronicity of this finding is unknown. Atherosclerotic disease in bilateral carotid arteries. Estimated degree of stenosis in the right internal carotid artery is less than 50%. Patent vertebral arteries. These results will be called to the ordering clinician or representative by the Radiologist Assistant, and communication documented in the PACS or zVision Dashboard. Electronically Signed   By: Richarda Overlie M.D.   On: 07/10/2016 15:46   Ct Head Code Stroke W/o Cm  Result Date: 07/13/2016 CLINICAL DATA:  Code stroke. 82 year old male with right side weakness and aphasia. However, known left ICA occlusion and abnormal left MCA with some left MCA territory infarcts since 07/10/2016 [facility] CT[facility], MRI, and MRA studies. EXAM: CT HEAD WITHOUT CONTRAST TECHNIQUE: Contiguous axial images were obtained from the base of the skull through the vertex without intravenous contrast. COMPARISON:  Noncontrast head CT, Brain MRI and intracranial MRA 07/10/2016, followed by CTA neck 07/10/2016 FINDINGS: Brain: Stable appearance of left temporal lobe and posterior left insula cortically based infarct which was restricted on diffusion 07/10/2016. [place] 2 cm area of cytotoxic edema at the junction of the posterior left temporal and left occipital lobes on series 21, image 17 today. Of note, lacunar type infarct in the ventral left thalamus is slightly less hypodense today. Superimposed chronic right cerebellar PICA territory infarct. No other  areas of acute cytotoxic edema. No acute intracranial hemorrhage identified. Stable ventricle size and configuration. No midline shift, mass effect, or evidence of intracranial mass lesion. Vascular: Hyperdense distal left MCA M1 and left MCA bifurcation, Dakota from the presentation head CT on 07/10/2016. Abnormal left M1 on MRA that day. Superimposed Calcified atherosclerosis at the skull base. Skull: No acute osseous abnormality identified. Sinuses/Orbits: Left sphenoid sinus bubbly opacity is stable. Other Visualized paranasal sinuses and mastoids are stable and well pneumatized. Other: No acute orbit or scalp soft tissue finding. Small midline occipital scalp lipoma. ASPECTS [facility] Stroke Program Early CT Score) Total score (0-10 with 10 being normal): Not really applicable considering there is a mix of recent core infarct and [place] cytotoxic edema in the left MCA territory in the setting of left ICA occlusion for at least 3 days. IMPRESSION: 1. Suspect worsening left MCA flow/occlusion since the 07/10/2016 imaging studies. Core infarct in the anterior left temporal lobe appears stable from the MRI 3 days ago, but the left MCA bifurcation now is hyperdense and there is a [place] 2 cm area of cytotoxic edema in the posterior left temporal/left occipital confluence. 2. No associated hemorrhage or intracranial mass effect. 3. ASPECTS is not really applicable in this clinical setting, and the constellation of findings was discussed by telephone with Dr. Ritta Slot on 07/13/2016 at 1058 hours. Electronically Signed   By: Odessa Fleming M.D.   On: 07/13/2016 11:13     PERTINENT LAB RESULTS: CBC:  Recent Labs  07/13/16 1047 07/13/16 1052  WBC 11.8*  --   HGB 12.5* 14.3  HCT 39.8 42.0  PLT 188  --  CMET CMP     Component Value Date/Time   NA 139 07/13/2016 1052   NA 140 12/27/2011 0547   K 4.2 07/13/2016 1052   K 4.4 12/27/2011 0547   CL 101 07/13/2016 1052   CL 108 (H) 12/27/2011 0547   CO2 30  07/13/2016 1047   CO2 23 12/27/2011 0547   GLUCOSE 104 (H) 07/13/2016 1052   GLUCOSE 108 (H) 12/27/2011 0547   BUN 24 (H) 07/13/2016 1052   BUN 16 12/27/2011 0547   CREATININE 1.20 07/13/2016 1052   CREATININE 0.96 12/27/2011 0547   CALCIUM 9.2 07/13/2016 1047   CALCIUM 7.8 (L) 12/27/2011 0547   PROT 6.6 07/13/2016 1047   ALBUMIN 3.5 07/13/2016 1047   AST 23 07/13/2016 1047   ALT 15 (L) 07/13/2016 1047   ALKPHOS 46 07/13/2016 1047   BILITOT 0.8 07/13/2016 1047   GFRNONAA 50 (L) 07/13/2016 1047   GFRNONAA >60 12/27/2011 0547   GFRAA 58 (L) 07/13/2016 1047   GFRAA >60 12/27/2011 0547    GFR Estimated Creatinine Clearance: 45 mL/min (by C-G formula based on SCr of 1.2 mg/dL). No results for input(s): LIPASE, AMYLASE in the last 72 hours.  Recent Labs  07/13/16 1637  TROPONINI 0.03*   Invalid input(s): POCBNP No results for input(s): DDIMER in the last 72 hours. No results for input(s): HGBA1C in the last 72 hours. No results for input(s): CHOL, HDL, LDLCALC, TRIG, CHOLHDL, LDLDIRECT in the last 72 hours. No results for input(s): TSH, T4TOTAL, T3FREE, THYROIDAB in the last 72 hours.  Invalid input(s): FREET3 No results for input(s): VITAMINB12, FOLATE, FERRITIN, TIBC, IRON, RETICCTPCT in the last 72 hours. Coags:  Recent Labs  07/13/16 1047 07/13/16 1808  INR 1.00 1.06   Microbiology: Recent Results (from the past 240 hour(s))  Urine culture     Status: None   Collection Time: 07/10/16 11:57 AM  Result Value Ref Range Status   Specimen Description URINE, RANDOM  Final   Special Requests NONE  Final   Culture NO GROWTH Performed at [facility]   Final   Report Status 07/11/2016 FINAL  Final    FURTHER DISCHARGE INSTRUCTIONS:  Get Medicines reviewed and adjusted: Please take all your medications with you for your next visit with your Primary MD  Laboratory/radiological data: Please request your Primary MD to go over all hospital tests and  procedure/radiological results at the follow up, please ask your Primary MD to get all Hospital records sent to his/her office.  In some cases, they will be blood work, cultures and biopsy results pending at the time of your discharge. Please request that your primary care M.D. goes through all the records of your hospital data and follows up on these results.  Also Note the following: If you experience worsening of your admission symptoms, develop shortness of breath, life threatening emergency, suicidal or homicidal thoughts you must seek medical attention immediately by calling 911 or calling your MD immediately  if symptoms less severe.  You must read complete instructions/literature along with all the possible adverse reactions/side effects for all the Medicines you take and that have been prescribed to you. Take any [place] Medicines after you have completely understood and accpet all the possible adverse reactions/side effects.   Do not drive when taking Pain medications or sleeping medications (Benzodaizepines)  Do not take more than prescribed Pain, Sleep and Anxiety Medications. It is not advisable to combine anxiety,sleep and pain medications without talking with your primary care practitioner  Special Instructions: If you have smoked or chewed Tobacco  in the last 2 yrs please stop smoking, stop any regular Alcohol  and or any Recreational drug use.  Wear Seat belts while driving.  Please note: You were cared for by a hospitalist during your hospital stay. Once you are discharged, your primary care physician will handle any further medical issues. Please note that NO REFILLS for any discharge medications will be authorized once you are discharged, as it is imperative that you return to your primary care physician (or establish a relationship with a primary care physician if you do not have one) for your post hospital discharge needs so that they can reassess your need for medications and  monitor your lab values.  Total Time spent coordinating discharge including counseling, education and face to face time equals  45 minutes.  SignedJeoffrey Massed 07/15/2016 11:06 AM

## 2016-07-15 NOTE — Progress Notes (Signed)
IV removed, all belongings sent with the patient.  Pt transported via [facility], family present.    Sondra Come, RN

## 2016-07-15 NOTE — Consult Note (Addendum)
   [facility] CM Inpatient Consult   07/15/2016  Dakota [name] 1925/09/24 [id number]    Patient screened for potential [facility] Care Management services. Chart reviewed. [name] is for comfort measures. Noted current discharge plan is for hospice facility. There are no identifiable [facility] Care Management needs at this time.   Raiford Noble, MSN-Ed, RN,BSN [facility] Liaison [phone]

## 2016-07-15 NOTE — Progress Notes (Signed)
STROKE TEAM PROGRESS NOTE   HISTORY OF PRESENT ILLNESS (per record) Dakota [name] is a 82 y.o. male who [facility] presumed acute left carotid occlusion. He did well there, with some improvement in his aphasia and was discharged home Saturday. He did well on Sunday, however this morning on awakening he was completely aphasic. He was therefore brought then via [facility] as a code stroke. He was LKW 07/07/2016, time unknown. Patient was not administered IV t-PA secondary to being outside of window. He was admitted for further evaluation and treatment.   SUBJECTIVE (INTERVAL HISTORY) Patient's wife is at the bedside. She has chosen comfort care and plans are to transfer to nursing home for the same when bed available OBJECTIVE Temp:  [97.6 F (36.4 C)-98.2 F (36.8 C)] 98.1 F (36.7 C) (12/13 1400) Pulse Rate:  [86-101] 96 (12/13 1400) Resp:  [17-20] 18 (12/13 1400) BP: (127-167)/(61-90) 132/74 (12/13 1400) SpO2:  [91 %-97 %] 91 % (12/13 1611)  CBC:   Recent Labs Lab 07/10/16 1157 07/11/16 0435 07/13/16 1047 07/13/16 1052  WBC 8.6 5.2 11.8*  --   NEUTROABS 5.9  --  8.4*  --   HGB 12.5* 13.0 12.5* 14.3  HCT 38.7* 40.1 39.8 42.0  MCV 94.3 93.1 94.1  --   PLT 153 154 188  --     Basic Metabolic Panel:   Recent Labs Lab 07/11/16 1224 07/13/16 1047 07/13/16 1052  NA 137 140 139  K 3.8 4.2 4.2  CL 100* 102 101  CO2 28 30  --   GLUCOSE 129* 106* 104*  BUN 26* 21* 24*  CREATININE 1.39* 1.24 1.20  CALCIUM 8.8* 9.2  --     Lipid Panel:     Component Value Date/Time   CHOL 160 07/11/2016 1224   TRIG 99 07/11/2016 1224   HDL 49 07/11/2016 1224   CHOLHDL 3.3 07/11/2016 1224   VLDL 20 07/11/2016 1224   LDLCALC 91 07/11/2016 1224   HgbA1c:  Lab Results  Component Value Date   HGBA1C 5.9 (H) 07/10/2016   Urine Drug Screen: No results found for: LABOPIA, COCAINSCRNUR, LABBENZ, AMPHETMU, THCU, LABBARB    IMAGING  No results  found.  PHYSICAL EXAM Pleasant elderly Caucasian male currently not in distress. . Afebrile. Head is nontraumatic. Neck is supple without bruit.    Cardiac exam no murmur or gallop. Lungs are clear to auscultation. Distal pulses are well felt. Neurological Exam :  Awake alert globally aphasic. Speech is nonfluent but not able to speak sentences. Follows only simple midline commands and gestures. Unable to name repeat. Extraocular moments are full range without nystagmus. Blinks to threat more on the left than the right. Mild right lower facial asymmetry. Tongue midline. Motor system exam able to move all 4 extremities against gravity with no lateralizing focal weakness. Deep tendon reflexes are symmetric. Plantars are downgoing. Gait was not tested  . ASSESSMENT/PLAN Mr. Dakota [name] is a 82 y.o. male with history of recent stroke due to carotid artery occlusion. Not felt to be a surgical candidate presenting with right-sided weakness and aphasia. He did not receive IV t-PA due to delay in arrival.   Stroke:  Left MCA infarct secondary to worsening L ICA flow/occlusion  CT head stable left MCA infarct in setting of worsening left ICA occlusion. New cytotoxic edema left MCA, posterior left temporal/left occipital area  Diet regular Room service appropriate? Yes; Fluid consistency: Thin  aspirin 81 mg daily  and clopidogrel 75 mg daily prior to admission, now on No antithrombotic  Wife clear she does not want further intervention. Palliative care requested.  Therapy recommendations:  SNF  Disposition:  pending   Hypertension  Stable   Hyperlipidemia  Home meds:  lipitor 40  Other Stroke Risk Factors  Advanced age  Hx stroke/TIA   12/2 left MCA infarct due to left ICA occlusion, not a surgical candidate017   Coronary artery disease s/p CABG   aortic stenosis with history of replacement  Other Active Problems  COPD without exacerbation   Rheumatoid arthritis  Hospital  day # 2  I have personally examined this patient, reviewed notes, independently viewed imaging studies, participated in medical decision making and plan of care.ROS completed by me personally and pertinent positives fully documented  I have made any additions or clarifications directly to the above note. Patient presented with aphasia secondary to left carotid occlusion and presented with worsening of deficits a few days later. Patient's wife states his baseline is poor and she will not be able to care for him and patient has been made comfort care. Agree with transfer to nursing home for palliative care when bed available. Stroke team will sign off. Kindly call for questions. Delia Heady, MD Medical Director [facility] Pager: [phone] 07/15/2016 5:15 PM   To contact Stroke Continuity provider, please refer to [email or web address]. After hours, contact General Neurology

## 2016-07-15 NOTE — Progress Notes (Signed)
PT Cancellation Note  Patient Details Name: Mariano Doshi MRN: [id number] DOB: Sep 27, 1925   Cancelled Treatment:    Reason Eval/Treat Not Completed: Per chart review, pt has transitioned to full comfort measures. Will sign off at this time. If needs change, please reconsult.    Marylynn Pearson 07/15/2016, 11:36 AM   Conni Slipper, PT, DPT Acute Rehabilitation Services Pager: [phone]

## 2016-07-15 NOTE — Care Management Note (Signed)
Case Management Note  Patient Details  Name: Dakota [name] MRN: [id number] Date of Birth: Mar 20, 1926  Subjective/Objective:                    Action/Plan: Pt discharging to [facility]. No further needs per CM.   Expected Discharge Date:                  Expected Discharge Plan:  Hospice Medical Facility  In-House Referral:     Discharge planning Services     Post Acute Care Choice:    Choice offered to:     DME Arranged:    DME Agency:     HH Arranged:    HH Agency:     Status of Service:  Completed, signed off  If discussed at [employer] of Stay Meetings, dates discussed:    Additional Comments:  Kermit Balo, RN 07/15/2016, 2:33 PM

## 2016-07-15 NOTE — Progress Notes (Signed)
Daily Progress Note   Patient Name: Dakota [name]       Date: 07/15/2016 DOB: 08/01/1926  Age: 82 y.o. MRN#: [id number] Attending Physician: Maretta Bees, MD Primary Care Physician: Elizabeth Sauer, MD Admit Date: 07/13/2016  Reason for Consultation/Follow-up: Establishing goals of care, Non pain symptom management, Pain control and Psychosocial/spiritual support  Subjective:  -continued conversation with family at bedside regarding diagnosis, prognosis, natural trajectory and expectations at EOL  -patient is having increased difficulty with swallowing, increased cough and secretions, audible throat secretions  -focus is full comfort, no life prolonging measures  Length of Stay: 2  Current Medications: Scheduled Meds:  . allopurinol  300 mg Oral BID  . chlorhexidine  15 mL Mouth Rinse BID  . hydroxychloroquine  400 mg Oral Daily  . ipratropium-albuterol  3 mL Nebulization Q4H  . mouth rinse  15 mL Mouth Rinse q12n4p    Continuous Infusions: . sodium chloride 10 mL/hr at 07/14/16 1513    PRN Meds: acetaminophen **OR** acetaminophen (TYLENOL) oral liquid 160 mg/5 mL **OR** acetaminophen, haloperidol lactate, morphine CONCENTRATE, senna-docusate  Physical Exam  Constitutional: He appears well-developed. He appears ill.  - severe aphasia   HENT:  -audible throat secretions  Cardiovascular: Tachycardia present.   Pulmonary/Chest: He has decreased breath sounds in the right lower field and the left lower field.  Skin: Skin is warm and dry.            Vital Signs: BP (!) 167/90 (BP Location: Right Arm)   Pulse 96   Temp 97.6 F (36.4 C) (Oral)   Resp 20   Ht 5\' 8"  (1.727 m)   Wt 84.3 kg (185 lb 13.6 oz)   SpO2 95%   BMI 28.26 kg/m  SpO2: SpO2: 95 % O2 Device: O2  Device: Not Delivered O2 Flow Rate:    Intake/output summary:  Intake/Output Summary (Last 24 hours) at 07/15/16 0916 Last data filed at 07/14/16 2300  Gross per 24 hour  Intake              120 ml  Output              450 ml  Net             -330 ml   LBM: Last BM Date: 07/12/16 Baseline Weight: Weight: 84.3  kg (185 lb 13.6 oz) Most recent weight: Weight: 84.3 kg (185 lb 13.6 oz)       Palliative Assessment/Data:  30 % at best    Flowsheet Rows   Flowsheet Row Most Recent Value  Intake Tab  Referral Department  Hospitalist  Unit at Time of Referral  Med/Surg Unit  Palliative Care Primary Diagnosis  Neurology  Date Notified  07/13/16  Palliative Care Type  New Palliative care  Reason for referral  Clarify Goals of Care  Date of Admission  07/13/16  Date first seen by Palliative Care  07/14/16  # of days Palliative referral response time  1 Day(s)  # of days IP prior to Palliative referral  0  Clinical Assessment  Psychosocial & Spiritual Assessment  Palliative Care Outcomes      Patient Active Problem List   Diagnosis Date Noted  . DNR (do not resuscitate) 07/14/2016  . Palliative care by specialist 07/14/2016  . Dysphagia   . Aphasia   . Cerebrovascular accident (CVA) (HCC)   . H/O aortic valve replacement   . Rheumatoid arthritis involving multiple sites (HCC)   . Expressive aphasia 07/11/2016  . Carotid stenosis 07/11/2016  . Occlusion and stenosis of vertebral artery 07/11/2016  . Receptive aphasia 07/11/2016  . Atherosclerosis of coronary artery of native heart without angina pectoris 05/12/2016  . Essential hypertension 05/12/2016  . Hyperlipidemia 05/12/2016    Palliative Care Assessment & Plan    Assessment: 82 y.o. male  admitted on 07/13/2016 with history of with recent admission for stroke from December 5, discharged on December 9, after presenting with new right sided weakness, and speech difficulty, in the setting of 100 % L carotid artery  occlusion.   He underwent extensive workup at the hospital and his symptoms had improved upon discharge.  At the time, he was not deemed to be a surgical candidate due to age and multiple commorbidities.   He presented to ER with similar symptoms including receptive and expressive aphasia, and right sided weakness. He appeared confused today. He was having difficulty swallowing as well.  CT of the head ( 07-13-16) Suspect worsening left MCA flow/occlusion since the 07/10/2016 imaging studies Core infarct in the anterior left temporal lobe appears stable from the MRI 3 days ago, but the left MCA bifurcation now is hyperdense and there is a new 2 cm area of cytotoxic edema in the posterior left temporal/left occipital confluence. 2. No associated hemorrhage or intracranial mass effect  Focus of care is comfort, no life prolonging measures.  [facility] is for hospice facility  Recommendations/Plan:  Utilization of mediations and supportive care measures to enhance comfort at EOL  Goals of Care and Additional Recommendations:  Limitations on Scope of Treatment: Full Comfort Care  Code Status:    Code Status Orders        Start     Ordered   07/13/16 1436  Do not attempt resuscitation (DNR)  Continuous    Question Answer Comment  In the event of cardiac or respiratory ARREST Do not call a "code blue"   In the event of cardiac or respiratory ARREST Do not perform Intubation, CPR, defibrillation or ACLS   In the event of cardiac or respiratory ARREST Use medication by any route, position, wound care, and other measures to relive pain and suffering. May use oxygen, suction and manual treatment of airway obstruction as needed for comfort.      07/13/16 1436    Code Status History  Date Active Date Inactive Code Status Order ID Comments User Context   07/13/2016  1:51 PM 07/13/2016  2:36 PM DNR [id number]  Marcos EkeSara E Wertman, PA-C ED   07/10/2016  4:34 PM 07/11/2016  7:06 PM Full Code  [id number]  Enid Baasadhika Kalisetti, MD Inpatient    Advance Directive Documentation   Flowsheet Row Most Recent Value  Type of Advance Directive  Healthcare Power of Attorney  Pre-existing out of facility DNR order (yellow form or pink MOST form)  No data  "MOST" Form in Place?  No data       Prognosis:   < 2 weeks, high risk for continued rapid  decompensation   Discharge Planning:  Hospice facility  Care plan was discussed with family and nursing  Thank you for allowing the Palliative Medicine Team to assist in the care of this patient.   Time In: 0745 Time Out: 0830 Total Time 45 min Prolonged Time Billed  no       Greater than 50%  of this time was spent counseling and coordinating care related to the above assessment and plan.  Lorinda CreedLARACH, Jade Burkard, NP  Please contact Palliative Medicine Team phone at [phone] for questions and concerns.

## 2016-07-15 NOTE — Clinical Social Work Note (Signed)
Clinical Social Work Assessment  Patient Details  Name: Dakota [name] MRN: [id number] Date of Birth: 1926/05/17  Date of referral:  07/15/16               Reason for consult:  Facility Placement, Grief and Loss, Discharge Planning, End of Life/Hospice                Permission sought to share information with:  Family Supports Permission granted to share information::  Yes, Verbal Permission Granted  Name::     Richarda Blade  Relationship::  girlfriend/partner  Contact Information:  [phone]  Housing/Transportation Living arrangements for the past 2 months:  [place] of Information:  Patient, Adult Children, Partner Patient Interpreter Needed:  None Criminal Activity/Legal Involvement Pertinent to Current Situation/Hospitalization:  No - Comment as needed Significant Relationships:  Adult Children, Friend, Other Family Members, Significant Other Lives with:  Significant Other Do you feel safe going back to the place where you live?  No (Pt is in need of a higher level of care) Need for family participation in patient care:  Yes (Comment)  Care giving concerns:  No care giving concerns identified.   Social Worker assessment / plan:  CSW met with pt and pt's partner. CSW introduced herself and explained role of social work. CSW also explained the process of discharging to residential hospice. Pt's partner shared that the choice for place is [facility]. CSW made referral and a bed was available today. Pt is ready for discharge today and will go to [facility]. CSW spoke with pt's daughter, who is in agreement as well. Pt's daughter completed admission paperwork. RN to call report. [facility] will provide transportation.   CSW provided supportive counseling around end of life concerns.   CSW is signing off as no further needs identified  Employment status:  Retired Nurse, adult PT Recommendations:  [place] / Referral to community resources:  Other (Comment Required) (Residential Hospice Facilties)  Patient/Family's Response to care:  Pt's partner was appreciative of CSW support.   Patient/Family's Understanding of and Emotional Response to Diagnosis, Current Treatment, and Prognosis:  Pt and family understand pt's prognosis and would like for pt to be comfortable.   Emotional Assessment Appearance:  Appears stated age Attitude/Demeanor/Rapport:  Unable to Assess Affect (typically observed):  Unable to Assess Orientation:  Fluctuating Orientation (Suspected and/or reported Sundowners), Oriented to Self Alcohol / Substance use:  Not Applicable Psych involvement (Current and /or in the community):  No (Comment)  Discharge Needs  Concerns to be addressed:  Grief and Loss Concerns Readmission within the last 30 days:  Yes Current discharge risk:  Terminally ill Barriers to Discharge:  No Barriers Identified   Darden Dates, LCSW 07/15/2016, 2:21 PM

## 2016-08-03 DEATH — deceased

## 2016-09-01 ENCOUNTER — Ambulatory Visit (INDEPENDENT_AMBULATORY_CARE_PROVIDER_SITE_OTHER): Payer: Medicare PPO | Admitting: Vascular Surgery

## 2017-11-27 IMAGING — CR DG CHEST 2V
2 series · 2 of 2 positions shown · non-contrast
Comparison: Portable chest x-ray July 10, 2016

CLINICAL DATA: Mental status changes. Respiratory distress. History
of aortic valve replacement, previous CVA, coronary artery disease,
former smoker, COPD.

EXAM:
CHEST  2 VIEW

[chest lat]
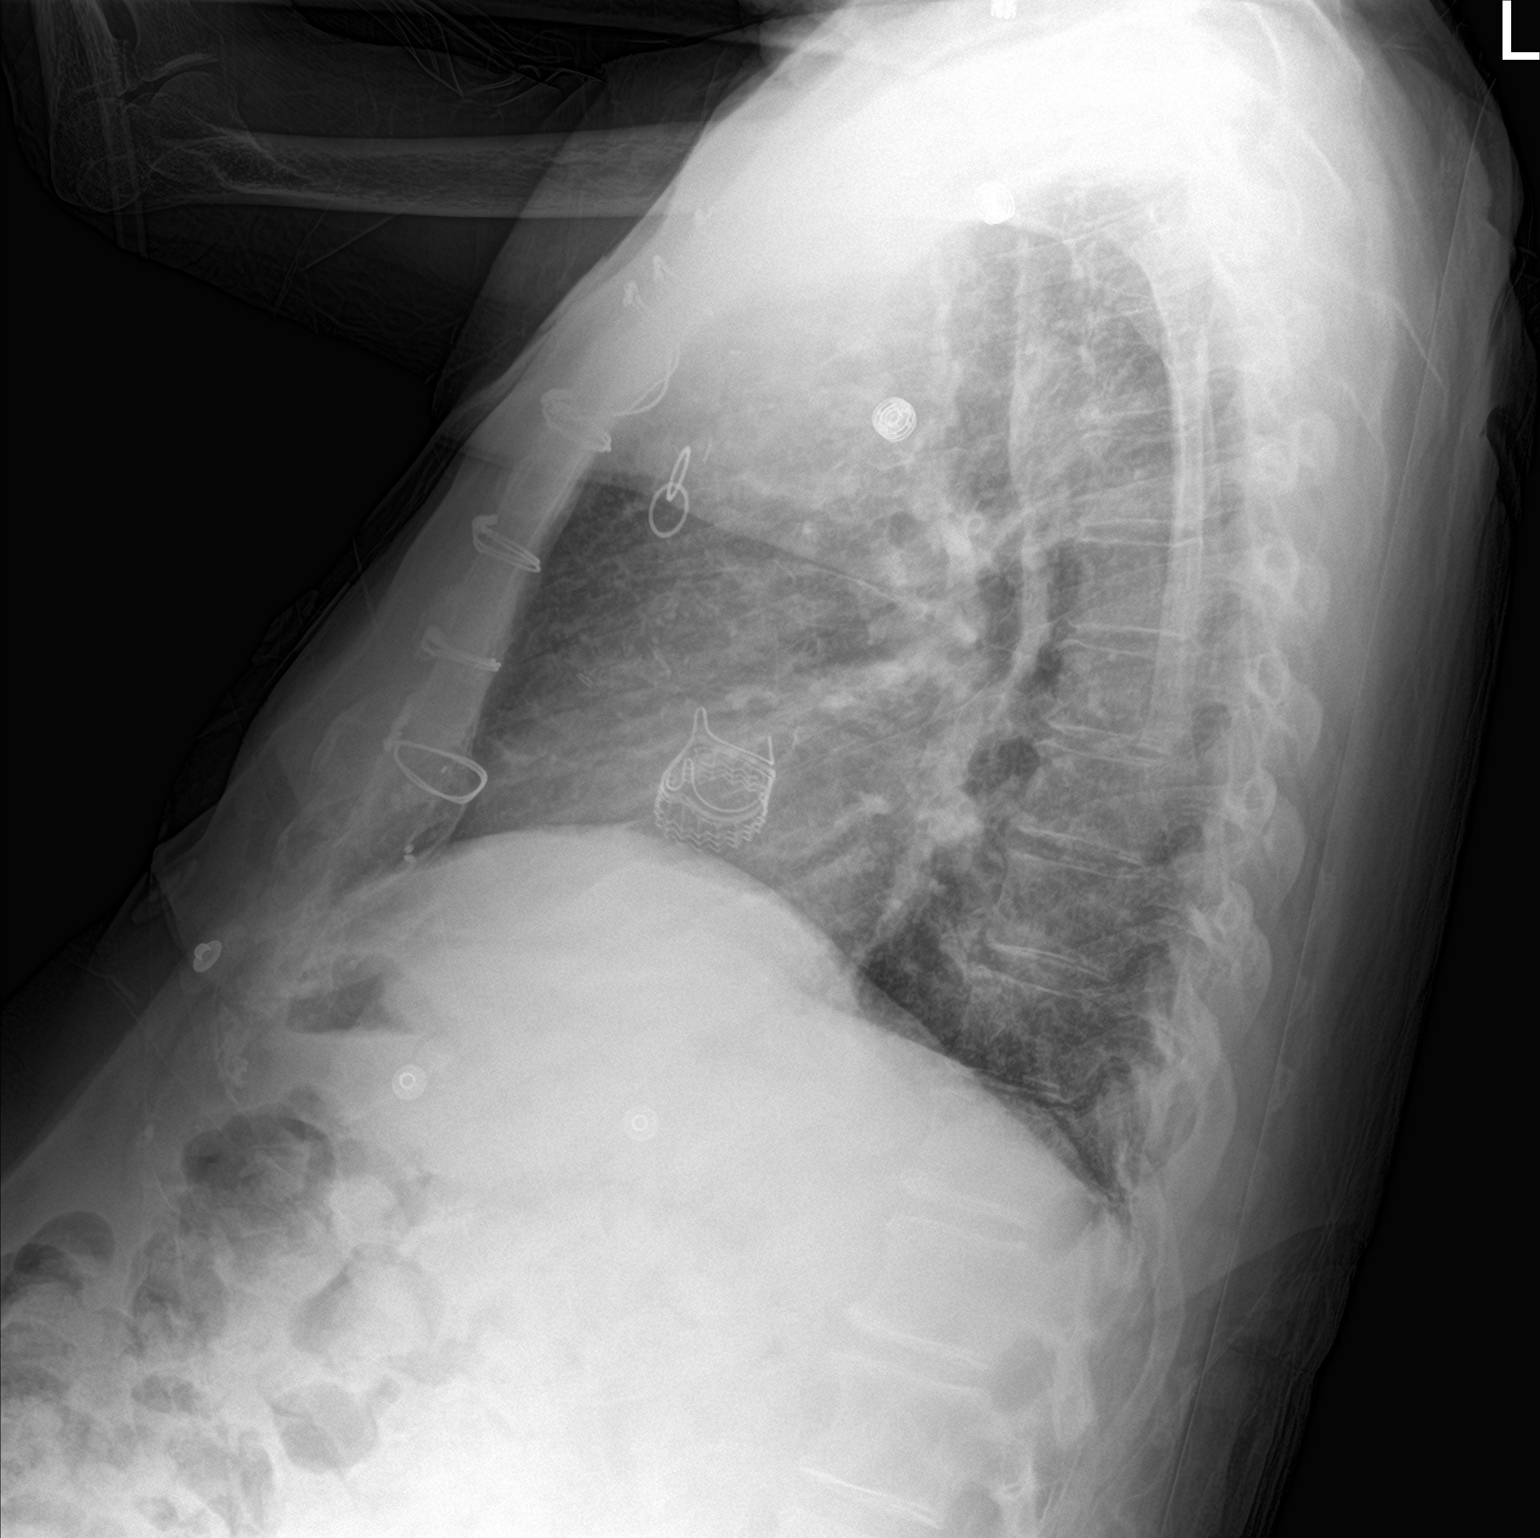

[chest ap]
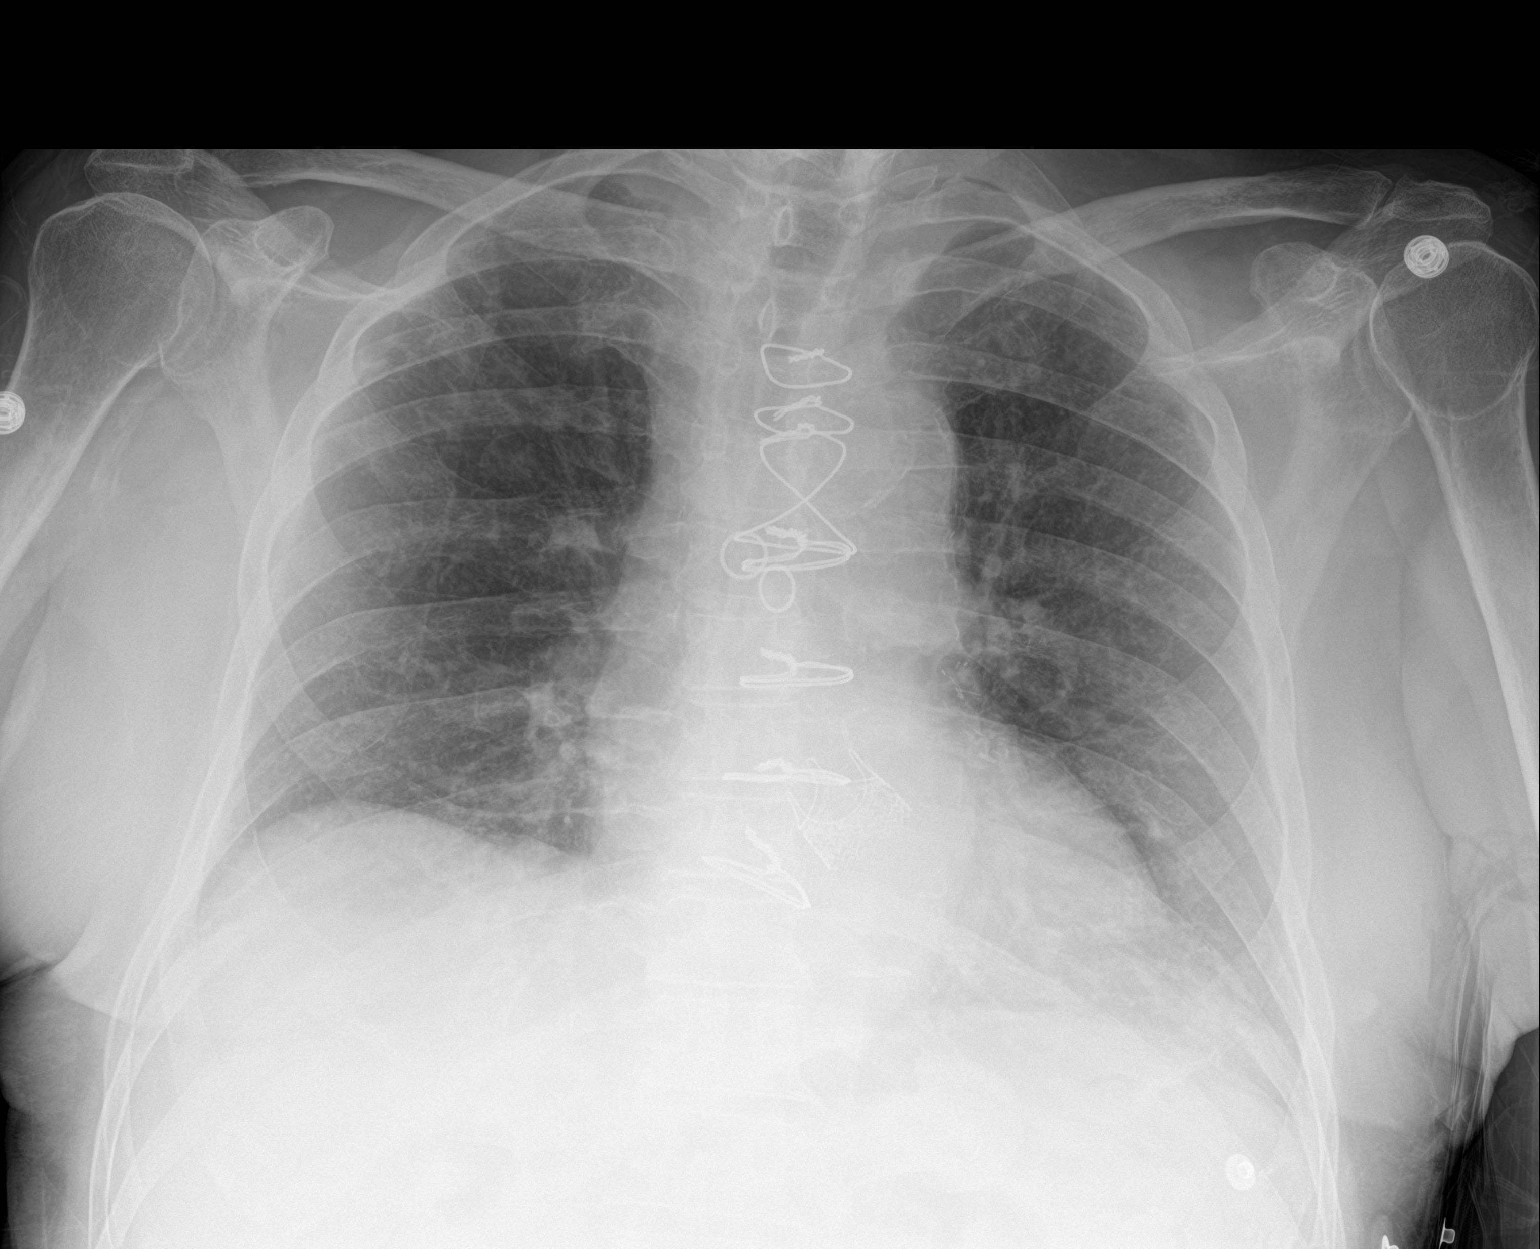

[2 of 2 positions shown; findings below may reference images not displayed]

FINDINGS: The lungs are adequately inflated. There is no focal infiltrate.
There is no pleural effusion. The patient has undergone previous
CABG and aortic valve replacement. There is calcification in the
wall of the thoracic aorta. The mediastinum is normal in width. The
bony thorax exhibits no acute abnormality.
IMPRESSION: There is no active cardiopulmonary disease. Previous CABG and aortic
valve replacement.

Thoracic aortic atherosclerosis.

## 2017-11-27 IMAGING — CT CT HEAD CODE STROKE
3 of 4 series · 16 of 47 positions shown, 19 images · non-contrast
Comparison: Noncontrast head CT, Brain MRI and intracranial MRA
07/10/2016, followed by CTA neck 07/10/2016

CLINICAL DATA: Code stroke. 89-year-old male with right side
weakness and aphasia. However, known left ICA occlusion and abnormal
left MCA with some left MCA territory infarcts since 07/10/2016
[HOSPITAL] CTA, MRI, and MRA studies.

EXAM:
CT HEAD WITHOUT CONTRAST
TECHNIQUE: Contiguous axial images were obtained from the base of the skull
through the vertex without intravenous contrast.

[Series 201: head w/o, idose (1) · axial · non-contrast · 0.46mm/px · z∈[+91,+216]mm · 10 of 31 slices shown, 13 images]
[im 3/31  brain]
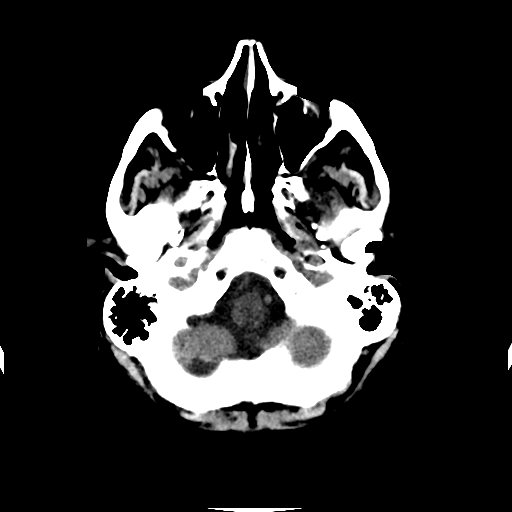
[im 3/31  bone]
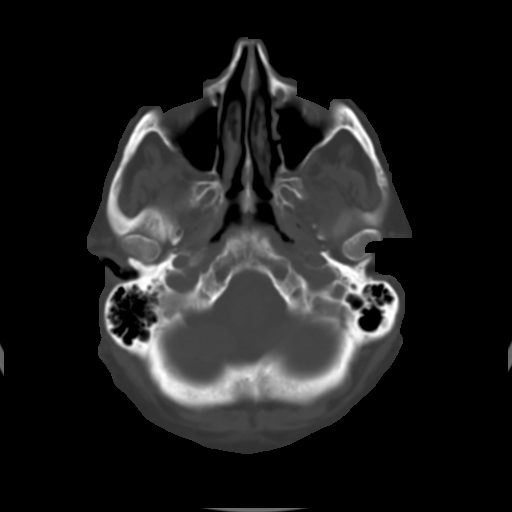
[im 5/31  brain]
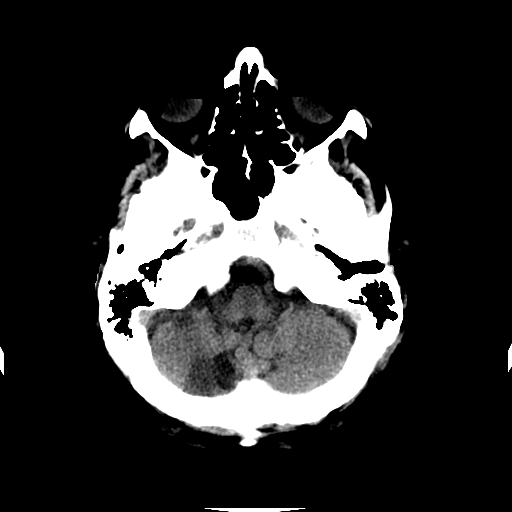
[im 9/31  brain]
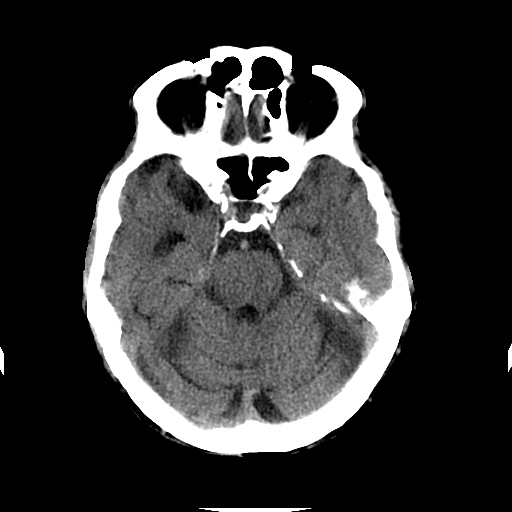
[im 11/31  brain]
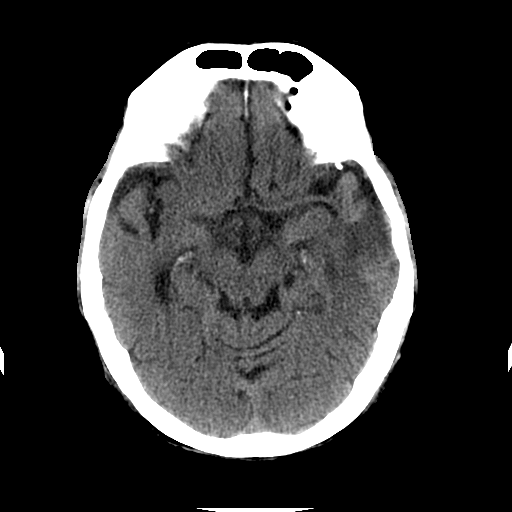
[im 13/31  brain]
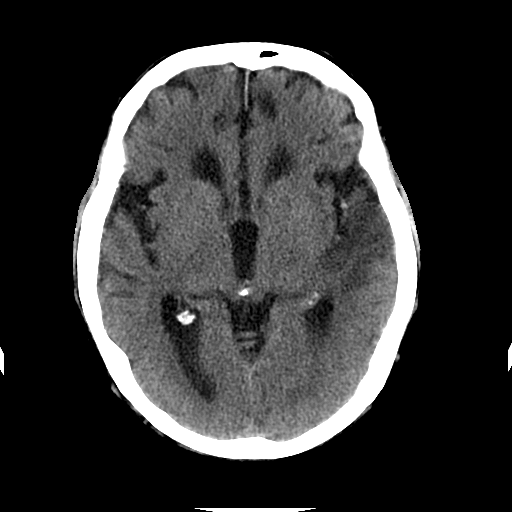
[im 13/31  bone]
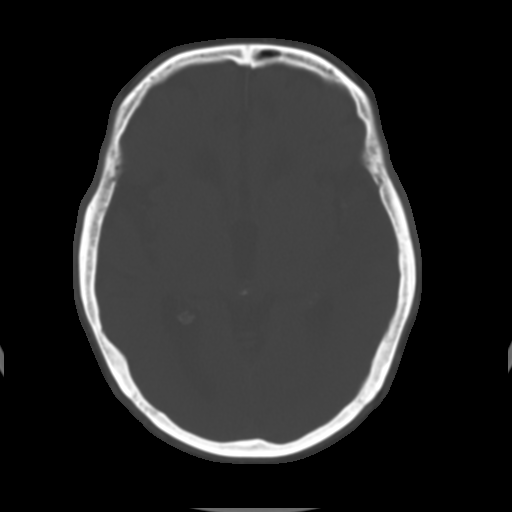
[im 18/31  brain]
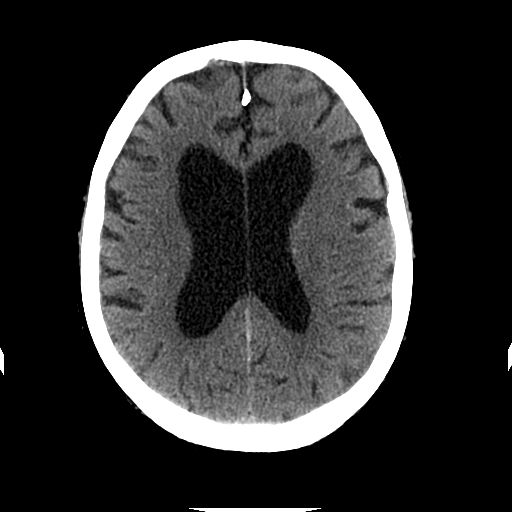
[im 20/31  brain]
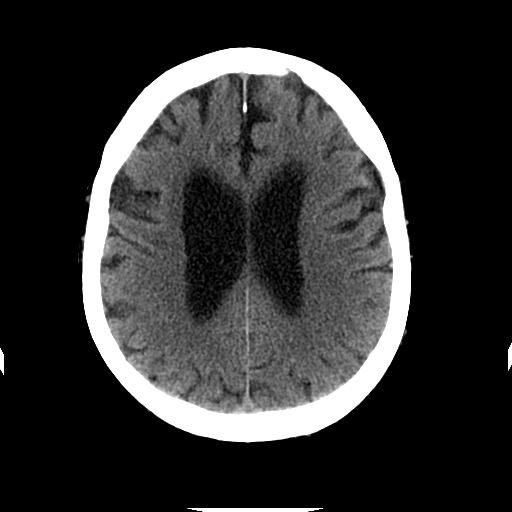
[im 22/31  brain]
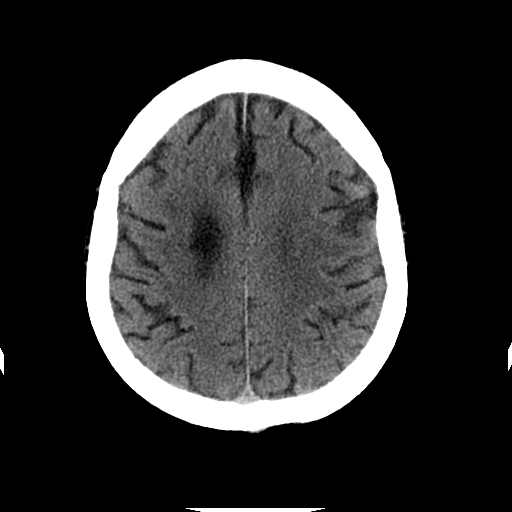
[im 26/31  brain]
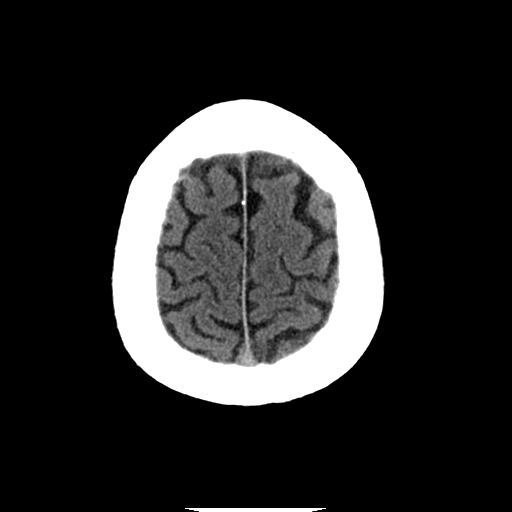
[im 26/31  bone]
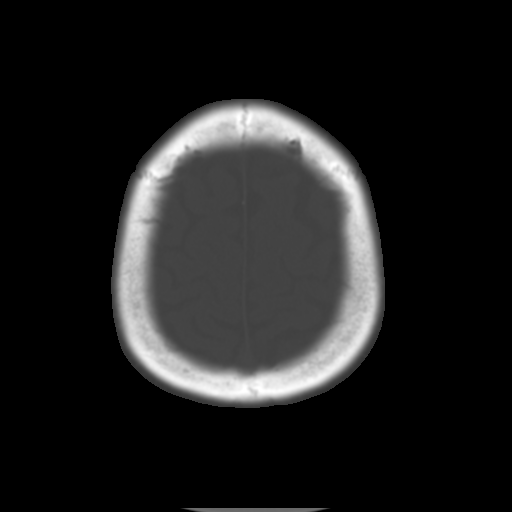
[im 28/31  brain]
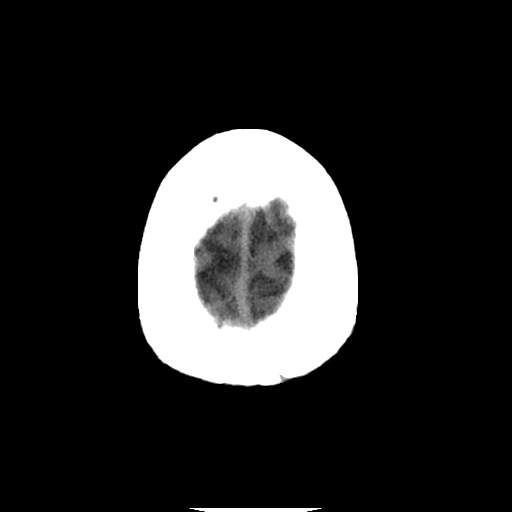

[Series 203: coronal st, idose (1) · coronal · 0.40mm/px · 3 of 75 slices shown]
[im 25/75  brain]
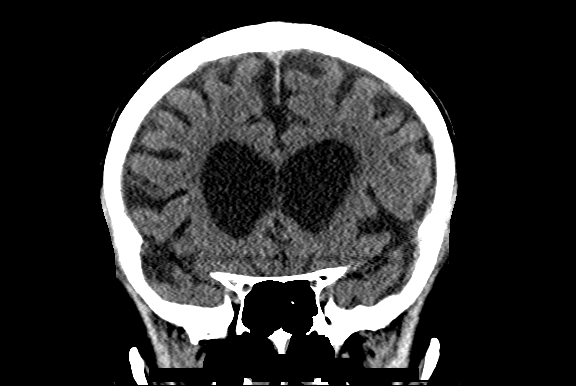
[im 33/75  brain]
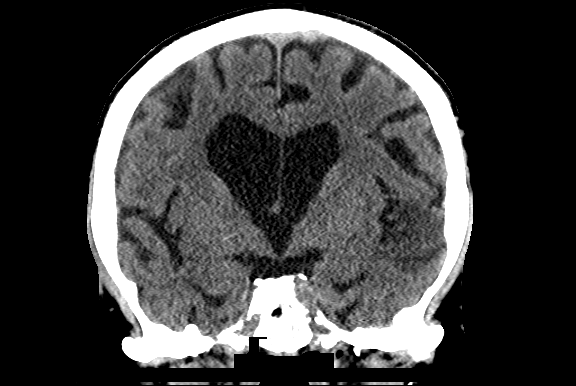
[im 42/75  brain]
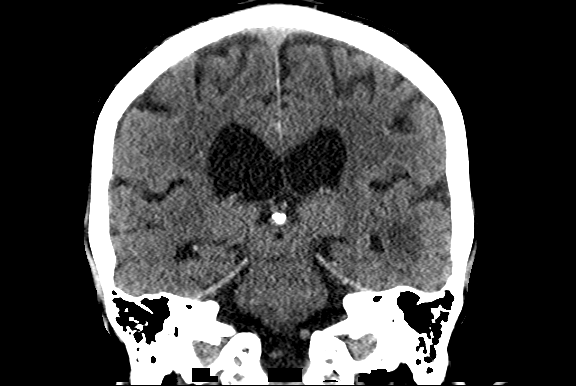

[Series 204: sagittal st, idose (1) · sagittal · 0.40mm/px · 3 of 77 slices shown]
[im 26/77  brain]
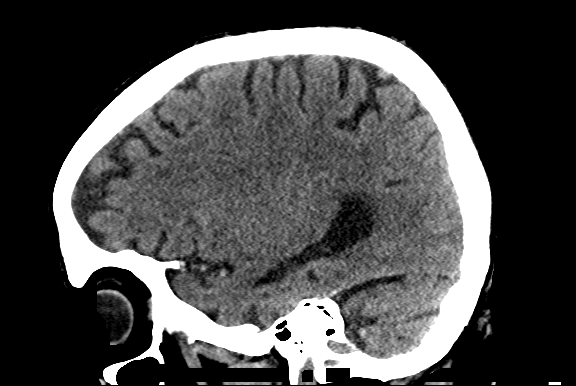
[im 39/77  brain]
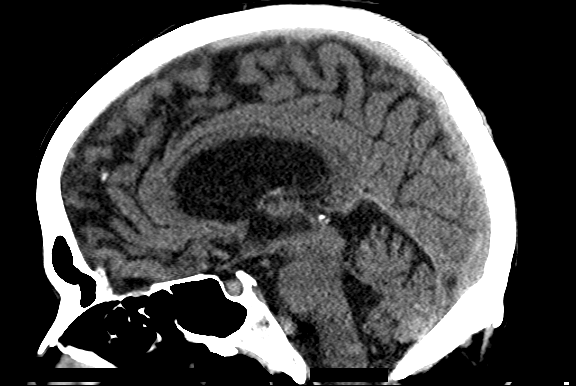
[im 51/77  brain]
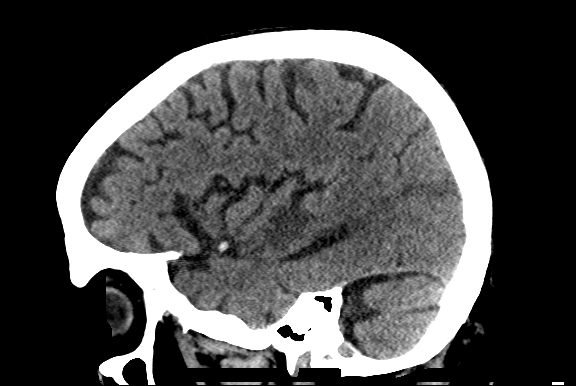

[16 of 47 positions shown; findings below may reference images not displayed]

FINDINGS: Brain: Stable appearance of left temporal lobe and posterior left
insula cortically based infarct which was restricted on diffusion
07/10/2016. New 2 cm area of cytotoxic edema at the junction of the
posterior left temporal and left occipital lobes on series 21, image
17 today. Of note, lacunar type infarct in the ventral left thalamus
is slightly less hypodense today. Superimposed chronic right
cerebellar PICA territory infarct. No other areas of acute cytotoxic
edema. No acute intracranial hemorrhage identified. Stable ventricle
size and configuration. No midline shift, mass effect, or evidence
of intracranial mass lesion.

Vascular: Hyperdense distal left MCA M1 and left MCA bifurcation,
new from the presentation head CT on 07/10/2016. Abnormal left M1 on
MRA that day. Superimposed Calcified atherosclerosis at the skull
base.

Skull: No acute osseous abnormality identified.

Sinuses/Orbits: Left sphenoid sinus bubbly opacity is stable. Other
Visualized paranasal sinuses and mastoids are stable and well
pneumatized.

Other: No acute orbit or scalp soft tissue finding. Small midline
occipital scalp lipoma.

ASPECTS (Alberta Stroke Program Early CT Score)

Total score (0-10 with 10 being normal): Not really applicable
considering there is a mix of recent core infarct and new cytotoxic
edema in the left MCA territory in the setting of left ICA occlusion
for at least 3 days.
IMPRESSION: 1. Suspect worsening left MCA flow/occlusion since the 07/10/2016
imaging studies.
Core infarct in the anterior left temporal lobe appears stable from
the MRI 3 days ago, but the left MCA bifurcation now is hyperdense
and there is a new 2 cm area of cytotoxic edema in the posterior
left temporal/left occipital confluence.
2. No associated hemorrhage or intracranial mass effect.
3. ASPECTS is not really applicable in this clinical setting, and
the constellation of findings was discussed by telephone with Dr.
Dennis Alexander Hoyos on 07/13/2016 at 7269 hours.
